# Patient Record
Sex: Male | Born: 1935
Health system: Southern US, Community
[De-identification: ages and names within clinical notes are randomized; demographics above are authoritative.]

## PROBLEM LIST (undated history)

## (undated) DIAGNOSIS — C61 Malignant neoplasm of prostate: Secondary | ICD-10-CM

## (undated) DIAGNOSIS — I10 Essential (primary) hypertension: Secondary | ICD-10-CM

## (undated) HISTORY — DX: Malignant neoplasm of prostate: C61

---

## 2007-07-15 ENCOUNTER — Other Ambulatory Visit: Payer: Self-pay

## 2007-07-15 ENCOUNTER — Emergency Department: Payer: Self-pay | Admitting: Emergency Medicine

## 2011-09-06 DIAGNOSIS — R0989 Other specified symptoms and signs involving the circulatory and respiratory systems: Secondary | ICD-10-CM | POA: Insufficient documentation

## 2011-12-24 DIAGNOSIS — L300 Nummular dermatitis: Secondary | ICD-10-CM | POA: Insufficient documentation

## 2011-12-24 DIAGNOSIS — L570 Actinic keratosis: Secondary | ICD-10-CM | POA: Insufficient documentation

## 2012-09-28 DIAGNOSIS — F17200 Nicotine dependence, unspecified, uncomplicated: Secondary | ICD-10-CM | POA: Insufficient documentation

## 2013-02-12 DIAGNOSIS — R7301 Impaired fasting glucose: Secondary | ICD-10-CM | POA: Insufficient documentation

## 2013-02-12 DIAGNOSIS — J309 Allergic rhinitis, unspecified: Secondary | ICD-10-CM | POA: Insufficient documentation

## 2013-08-21 DIAGNOSIS — I10 Essential (primary) hypertension: Secondary | ICD-10-CM | POA: Insufficient documentation

## 2014-03-27 DIAGNOSIS — Z79899 Other long term (current) drug therapy: Secondary | ICD-10-CM | POA: Insufficient documentation

## 2014-03-27 DIAGNOSIS — E785 Hyperlipidemia, unspecified: Secondary | ICD-10-CM | POA: Insufficient documentation

## 2014-04-18 DIAGNOSIS — Z79899 Other long term (current) drug therapy: Secondary | ICD-10-CM | POA: Diagnosis not present

## 2014-04-18 DIAGNOSIS — E785 Hyperlipidemia, unspecified: Secondary | ICD-10-CM | POA: Diagnosis not present

## 2014-04-18 DIAGNOSIS — I10 Essential (primary) hypertension: Secondary | ICD-10-CM | POA: Diagnosis not present

## 2014-04-18 DIAGNOSIS — R7301 Impaired fasting glucose: Secondary | ICD-10-CM | POA: Diagnosis not present

## 2014-04-23 DIAGNOSIS — R7301 Impaired fasting glucose: Secondary | ICD-10-CM | POA: Diagnosis not present

## 2014-04-23 DIAGNOSIS — F419 Anxiety disorder, unspecified: Secondary | ICD-10-CM | POA: Diagnosis not present

## 2014-04-23 DIAGNOSIS — Z23 Encounter for immunization: Secondary | ICD-10-CM | POA: Diagnosis not present

## 2014-04-23 DIAGNOSIS — Z79899 Other long term (current) drug therapy: Secondary | ICD-10-CM | POA: Diagnosis not present

## 2014-04-23 DIAGNOSIS — E785 Hyperlipidemia, unspecified: Secondary | ICD-10-CM | POA: Diagnosis not present

## 2014-04-23 DIAGNOSIS — J309 Allergic rhinitis, unspecified: Secondary | ICD-10-CM | POA: Diagnosis not present

## 2014-04-23 DIAGNOSIS — I10 Essential (primary) hypertension: Secondary | ICD-10-CM | POA: Diagnosis not present

## 2014-10-18 DIAGNOSIS — Z79899 Other long term (current) drug therapy: Secondary | ICD-10-CM | POA: Diagnosis not present

## 2014-10-18 DIAGNOSIS — E785 Hyperlipidemia, unspecified: Secondary | ICD-10-CM | POA: Diagnosis not present

## 2014-10-22 DIAGNOSIS — Z23 Encounter for immunization: Secondary | ICD-10-CM | POA: Diagnosis not present

## 2014-10-22 DIAGNOSIS — R7301 Impaired fasting glucose: Secondary | ICD-10-CM | POA: Diagnosis not present

## 2014-10-22 DIAGNOSIS — F419 Anxiety disorder, unspecified: Secondary | ICD-10-CM | POA: Diagnosis not present

## 2014-10-22 DIAGNOSIS — I10 Essential (primary) hypertension: Secondary | ICD-10-CM | POA: Diagnosis not present

## 2014-10-22 DIAGNOSIS — E785 Hyperlipidemia, unspecified: Secondary | ICD-10-CM | POA: Diagnosis not present

## 2014-10-22 DIAGNOSIS — Z79899 Other long term (current) drug therapy: Secondary | ICD-10-CM | POA: Diagnosis not present

## 2014-10-30 DIAGNOSIS — Z23 Encounter for immunization: Secondary | ICD-10-CM | POA: Diagnosis not present

## 2015-02-04 DIAGNOSIS — L570 Actinic keratosis: Secondary | ICD-10-CM | POA: Diagnosis not present

## 2015-02-04 DIAGNOSIS — L820 Inflamed seborrheic keratosis: Secondary | ICD-10-CM | POA: Diagnosis not present

## 2015-04-26 DIAGNOSIS — H6121 Impacted cerumen, right ear: Secondary | ICD-10-CM | POA: Diagnosis not present

## 2015-05-05 DIAGNOSIS — R7301 Impaired fasting glucose: Secondary | ICD-10-CM | POA: Diagnosis not present

## 2015-05-05 DIAGNOSIS — E785 Hyperlipidemia, unspecified: Secondary | ICD-10-CM | POA: Diagnosis not present

## 2015-05-05 DIAGNOSIS — I10 Essential (primary) hypertension: Secondary | ICD-10-CM | POA: Diagnosis not present

## 2015-05-05 DIAGNOSIS — Z79899 Other long term (current) drug therapy: Secondary | ICD-10-CM | POA: Diagnosis not present

## 2015-05-12 DIAGNOSIS — R7301 Impaired fasting glucose: Secondary | ICD-10-CM | POA: Diagnosis not present

## 2015-05-12 DIAGNOSIS — I10 Essential (primary) hypertension: Secondary | ICD-10-CM | POA: Diagnosis not present

## 2015-05-12 DIAGNOSIS — H9190 Unspecified hearing loss, unspecified ear: Secondary | ICD-10-CM | POA: Diagnosis not present

## 2015-05-12 DIAGNOSIS — Z79899 Other long term (current) drug therapy: Secondary | ICD-10-CM | POA: Diagnosis not present

## 2015-05-12 DIAGNOSIS — R0683 Snoring: Secondary | ICD-10-CM | POA: Diagnosis not present

## 2015-05-12 DIAGNOSIS — E785 Hyperlipidemia, unspecified: Secondary | ICD-10-CM | POA: Diagnosis not present

## 2015-05-12 DIAGNOSIS — Z23 Encounter for immunization: Secondary | ICD-10-CM | POA: Diagnosis not present

## 2015-07-28 DIAGNOSIS — Z79899 Other long term (current) drug therapy: Secondary | ICD-10-CM | POA: Diagnosis not present

## 2015-07-28 DIAGNOSIS — D649 Anemia, unspecified: Secondary | ICD-10-CM | POA: Diagnosis not present

## 2015-07-28 DIAGNOSIS — I10 Essential (primary) hypertension: Secondary | ICD-10-CM | POA: Diagnosis not present

## 2015-07-28 DIAGNOSIS — Z23 Encounter for immunization: Secondary | ICD-10-CM | POA: Diagnosis not present

## 2015-07-28 DIAGNOSIS — R7301 Impaired fasting glucose: Secondary | ICD-10-CM | POA: Diagnosis not present

## 2015-07-28 DIAGNOSIS — E785 Hyperlipidemia, unspecified: Secondary | ICD-10-CM | POA: Diagnosis not present

## 2015-10-27 DIAGNOSIS — E785 Hyperlipidemia, unspecified: Secondary | ICD-10-CM | POA: Diagnosis not present

## 2015-10-27 DIAGNOSIS — D649 Anemia, unspecified: Secondary | ICD-10-CM | POA: Diagnosis not present

## 2015-10-27 DIAGNOSIS — Z79899 Other long term (current) drug therapy: Secondary | ICD-10-CM | POA: Diagnosis not present

## 2016-03-01 DIAGNOSIS — Z79899 Other long term (current) drug therapy: Secondary | ICD-10-CM | POA: Diagnosis not present

## 2016-03-01 DIAGNOSIS — E785 Hyperlipidemia, unspecified: Secondary | ICD-10-CM | POA: Diagnosis not present

## 2016-03-08 DIAGNOSIS — R7301 Impaired fasting glucose: Secondary | ICD-10-CM | POA: Diagnosis not present

## 2016-03-08 DIAGNOSIS — G479 Sleep disorder, unspecified: Secondary | ICD-10-CM | POA: Diagnosis not present

## 2016-03-08 DIAGNOSIS — E785 Hyperlipidemia, unspecified: Secondary | ICD-10-CM | POA: Diagnosis not present

## 2016-03-08 DIAGNOSIS — Z2821 Immunization not carried out because of patient refusal: Secondary | ICD-10-CM | POA: Insufficient documentation

## 2016-03-08 DIAGNOSIS — I10 Essential (primary) hypertension: Secondary | ICD-10-CM | POA: Diagnosis not present

## 2016-03-08 DIAGNOSIS — Z23 Encounter for immunization: Secondary | ICD-10-CM | POA: Diagnosis not present

## 2016-03-08 DIAGNOSIS — Z79899 Other long term (current) drug therapy: Secondary | ICD-10-CM | POA: Diagnosis not present

## 2016-03-09 DIAGNOSIS — X32XXXA Exposure to sunlight, initial encounter: Secondary | ICD-10-CM | POA: Diagnosis not present

## 2016-03-09 DIAGNOSIS — D1801 Hemangioma of skin and subcutaneous tissue: Secondary | ICD-10-CM | POA: Diagnosis not present

## 2016-03-09 DIAGNOSIS — L570 Actinic keratosis: Secondary | ICD-10-CM | POA: Diagnosis not present

## 2016-03-09 DIAGNOSIS — L821 Other seborrheic keratosis: Secondary | ICD-10-CM | POA: Diagnosis not present

## 2016-03-09 DIAGNOSIS — D485 Neoplasm of uncertain behavior of skin: Secondary | ICD-10-CM | POA: Diagnosis not present

## 2016-03-09 DIAGNOSIS — L820 Inflamed seborrheic keratosis: Secondary | ICD-10-CM | POA: Diagnosis not present

## 2016-04-19 DIAGNOSIS — L570 Actinic keratosis: Secondary | ICD-10-CM | POA: Diagnosis not present

## 2016-04-19 DIAGNOSIS — L821 Other seborrheic keratosis: Secondary | ICD-10-CM | POA: Diagnosis not present

## 2016-04-19 DIAGNOSIS — X32XXXA Exposure to sunlight, initial encounter: Secondary | ICD-10-CM | POA: Diagnosis not present

## 2016-04-21 DIAGNOSIS — G4719 Other hypersomnia: Secondary | ICD-10-CM | POA: Diagnosis not present

## 2016-04-21 DIAGNOSIS — R0683 Snoring: Secondary | ICD-10-CM | POA: Diagnosis not present

## 2016-04-21 DIAGNOSIS — I10 Essential (primary) hypertension: Secondary | ICD-10-CM | POA: Diagnosis not present

## 2016-05-14 DIAGNOSIS — G4710 Hypersomnia, unspecified: Secondary | ICD-10-CM | POA: Diagnosis not present

## 2016-05-21 DIAGNOSIS — G4733 Obstructive sleep apnea (adult) (pediatric): Secondary | ICD-10-CM | POA: Diagnosis not present

## 2016-05-24 DIAGNOSIS — I10 Essential (primary) hypertension: Secondary | ICD-10-CM | POA: Diagnosis not present

## 2016-05-24 DIAGNOSIS — R0683 Snoring: Secondary | ICD-10-CM | POA: Diagnosis not present

## 2016-05-24 DIAGNOSIS — G4733 Obstructive sleep apnea (adult) (pediatric): Secondary | ICD-10-CM | POA: Diagnosis not present

## 2016-05-24 DIAGNOSIS — F17200 Nicotine dependence, unspecified, uncomplicated: Secondary | ICD-10-CM | POA: Diagnosis not present

## 2016-07-22 DIAGNOSIS — G4733 Obstructive sleep apnea (adult) (pediatric): Secondary | ICD-10-CM | POA: Diagnosis not present

## 2016-07-23 DIAGNOSIS — G4719 Other hypersomnia: Secondary | ICD-10-CM | POA: Diagnosis not present

## 2016-07-23 DIAGNOSIS — Z9989 Dependence on other enabling machines and devices: Secondary | ICD-10-CM | POA: Diagnosis not present

## 2016-07-23 DIAGNOSIS — G4733 Obstructive sleep apnea (adult) (pediatric): Secondary | ICD-10-CM | POA: Insufficient documentation

## 2016-07-23 DIAGNOSIS — G4769 Other sleep related movement disorders: Secondary | ICD-10-CM | POA: Diagnosis not present

## 2016-07-23 DIAGNOSIS — I10 Essential (primary) hypertension: Secondary | ICD-10-CM | POA: Diagnosis not present

## 2016-08-30 DIAGNOSIS — E785 Hyperlipidemia, unspecified: Secondary | ICD-10-CM | POA: Diagnosis not present

## 2016-08-30 DIAGNOSIS — R7301 Impaired fasting glucose: Secondary | ICD-10-CM | POA: Diagnosis not present

## 2016-08-30 DIAGNOSIS — I10 Essential (primary) hypertension: Secondary | ICD-10-CM | POA: Diagnosis not present

## 2016-08-30 DIAGNOSIS — Z79899 Other long term (current) drug therapy: Secondary | ICD-10-CM | POA: Diagnosis not present

## 2016-09-06 DIAGNOSIS — L570 Actinic keratosis: Secondary | ICD-10-CM | POA: Diagnosis not present

## 2016-09-06 DIAGNOSIS — D1801 Hemangioma of skin and subcutaneous tissue: Secondary | ICD-10-CM | POA: Diagnosis not present

## 2016-09-06 DIAGNOSIS — L821 Other seborrheic keratosis: Secondary | ICD-10-CM | POA: Diagnosis not present

## 2016-09-06 DIAGNOSIS — X32XXXA Exposure to sunlight, initial encounter: Secondary | ICD-10-CM | POA: Diagnosis not present

## 2016-09-06 DIAGNOSIS — I781 Nevus, non-neoplastic: Secondary | ICD-10-CM | POA: Diagnosis not present

## 2016-10-04 DIAGNOSIS — I10 Essential (primary) hypertension: Secondary | ICD-10-CM | POA: Diagnosis not present

## 2016-10-04 DIAGNOSIS — Z79899 Other long term (current) drug therapy: Secondary | ICD-10-CM | POA: Diagnosis not present

## 2016-10-04 DIAGNOSIS — R7301 Impaired fasting glucose: Secondary | ICD-10-CM | POA: Diagnosis not present

## 2016-10-04 DIAGNOSIS — E785 Hyperlipidemia, unspecified: Secondary | ICD-10-CM | POA: Diagnosis not present

## 2016-10-18 DIAGNOSIS — D2262 Melanocytic nevi of left upper limb, including shoulder: Secondary | ICD-10-CM | POA: Diagnosis not present

## 2016-10-18 DIAGNOSIS — D2261 Melanocytic nevi of right upper limb, including shoulder: Secondary | ICD-10-CM | POA: Diagnosis not present

## 2016-10-18 DIAGNOSIS — D225 Melanocytic nevi of trunk: Secondary | ICD-10-CM | POA: Diagnosis not present

## 2016-10-18 DIAGNOSIS — L570 Actinic keratosis: Secondary | ICD-10-CM | POA: Diagnosis not present

## 2016-11-16 DIAGNOSIS — I10 Essential (primary) hypertension: Secondary | ICD-10-CM | POA: Diagnosis not present

## 2016-11-16 DIAGNOSIS — R05 Cough: Secondary | ICD-10-CM | POA: Diagnosis not present

## 2016-11-16 DIAGNOSIS — J019 Acute sinusitis, unspecified: Secondary | ICD-10-CM | POA: Diagnosis not present

## 2017-02-07 DIAGNOSIS — I10 Essential (primary) hypertension: Secondary | ICD-10-CM | POA: Diagnosis not present

## 2017-02-07 DIAGNOSIS — E785 Hyperlipidemia, unspecified: Secondary | ICD-10-CM | POA: Diagnosis not present

## 2017-02-07 DIAGNOSIS — Z79899 Other long term (current) drug therapy: Secondary | ICD-10-CM | POA: Diagnosis not present

## 2017-02-14 DIAGNOSIS — E785 Hyperlipidemia, unspecified: Secondary | ICD-10-CM | POA: Diagnosis not present

## 2017-02-14 DIAGNOSIS — Z23 Encounter for immunization: Secondary | ICD-10-CM | POA: Diagnosis not present

## 2017-02-14 DIAGNOSIS — R7301 Impaired fasting glucose: Secondary | ICD-10-CM | POA: Diagnosis not present

## 2017-02-14 DIAGNOSIS — Z79899 Other long term (current) drug therapy: Secondary | ICD-10-CM | POA: Diagnosis not present

## 2017-02-14 DIAGNOSIS — I10 Essential (primary) hypertension: Secondary | ICD-10-CM | POA: Diagnosis not present

## 2017-03-07 DIAGNOSIS — L821 Other seborrheic keratosis: Secondary | ICD-10-CM | POA: Diagnosis not present

## 2017-03-07 DIAGNOSIS — X32XXXA Exposure to sunlight, initial encounter: Secondary | ICD-10-CM | POA: Diagnosis not present

## 2017-03-07 DIAGNOSIS — L570 Actinic keratosis: Secondary | ICD-10-CM | POA: Diagnosis not present

## 2017-08-08 DIAGNOSIS — I10 Essential (primary) hypertension: Secondary | ICD-10-CM | POA: Diagnosis not present

## 2017-08-08 DIAGNOSIS — Z79899 Other long term (current) drug therapy: Secondary | ICD-10-CM | POA: Diagnosis not present

## 2017-08-08 DIAGNOSIS — R7301 Impaired fasting glucose: Secondary | ICD-10-CM | POA: Diagnosis not present

## 2017-08-08 DIAGNOSIS — E785 Hyperlipidemia, unspecified: Secondary | ICD-10-CM | POA: Diagnosis not present

## 2017-08-16 DIAGNOSIS — Z79899 Other long term (current) drug therapy: Secondary | ICD-10-CM | POA: Diagnosis not present

## 2017-08-16 DIAGNOSIS — R7301 Impaired fasting glucose: Secondary | ICD-10-CM | POA: Diagnosis not present

## 2017-08-16 DIAGNOSIS — M720 Palmar fascial fibromatosis [Dupuytren]: Secondary | ICD-10-CM | POA: Insufficient documentation

## 2017-08-16 DIAGNOSIS — R809 Proteinuria, unspecified: Secondary | ICD-10-CM | POA: Diagnosis not present

## 2017-08-16 DIAGNOSIS — I10 Essential (primary) hypertension: Secondary | ICD-10-CM | POA: Diagnosis not present

## 2017-08-16 DIAGNOSIS — E785 Hyperlipidemia, unspecified: Secondary | ICD-10-CM | POA: Diagnosis not present

## 2017-08-29 DIAGNOSIS — I10 Essential (primary) hypertension: Secondary | ICD-10-CM | POA: Diagnosis not present

## 2017-08-29 DIAGNOSIS — M720 Palmar fascial fibromatosis [Dupuytren]: Secondary | ICD-10-CM | POA: Diagnosis not present

## 2017-09-12 DIAGNOSIS — L570 Actinic keratosis: Secondary | ICD-10-CM | POA: Diagnosis not present

## 2017-09-12 DIAGNOSIS — L821 Other seborrheic keratosis: Secondary | ICD-10-CM | POA: Diagnosis not present

## 2017-09-12 DIAGNOSIS — X32XXXA Exposure to sunlight, initial encounter: Secondary | ICD-10-CM | POA: Diagnosis not present

## 2017-09-12 DIAGNOSIS — S80862A Insect bite (nonvenomous), left lower leg, initial encounter: Secondary | ICD-10-CM | POA: Diagnosis not present

## 2017-09-26 DIAGNOSIS — R94128 Abnormal results of other function studies of ear and other special senses: Secondary | ICD-10-CM | POA: Diagnosis not present

## 2017-09-26 DIAGNOSIS — Z01118 Encounter for examination of ears and hearing with other abnormal findings: Secondary | ICD-10-CM | POA: Diagnosis not present

## 2017-09-26 DIAGNOSIS — Z23 Encounter for immunization: Secondary | ICD-10-CM | POA: Diagnosis not present

## 2017-09-26 DIAGNOSIS — Z0000 Encounter for general adult medical examination without abnormal findings: Secondary | ICD-10-CM | POA: Diagnosis not present

## 2017-09-26 DIAGNOSIS — I10 Essential (primary) hypertension: Secondary | ICD-10-CM | POA: Diagnosis not present

## 2017-12-26 DIAGNOSIS — S39013A Strain of muscle, fascia and tendon of pelvis, initial encounter: Secondary | ICD-10-CM | POA: Diagnosis not present

## 2017-12-26 DIAGNOSIS — M79651 Pain in right thigh: Secondary | ICD-10-CM | POA: Diagnosis not present

## 2017-12-26 DIAGNOSIS — M25551 Pain in right hip: Secondary | ICD-10-CM | POA: Diagnosis not present

## 2017-12-26 DIAGNOSIS — M1611 Unilateral primary osteoarthritis, right hip: Secondary | ICD-10-CM | POA: Diagnosis not present

## 2017-12-26 DIAGNOSIS — M47816 Spondylosis without myelopathy or radiculopathy, lumbar region: Secondary | ICD-10-CM | POA: Diagnosis not present

## 2018-01-04 DIAGNOSIS — S76011A Strain of muscle, fascia and tendon of right hip, initial encounter: Secondary | ICD-10-CM | POA: Diagnosis not present

## 2018-01-10 DIAGNOSIS — M25551 Pain in right hip: Secondary | ICD-10-CM | POA: Diagnosis not present

## 2018-01-10 DIAGNOSIS — M1611 Unilateral primary osteoarthritis, right hip: Secondary | ICD-10-CM | POA: Diagnosis not present

## 2018-01-13 DIAGNOSIS — M5136 Other intervertebral disc degeneration, lumbar region: Secondary | ICD-10-CM | POA: Diagnosis not present

## 2018-01-13 DIAGNOSIS — I878 Other specified disorders of veins: Secondary | ICD-10-CM | POA: Diagnosis not present

## 2018-01-13 DIAGNOSIS — Z87891 Personal history of nicotine dependence: Secondary | ICD-10-CM | POA: Diagnosis not present

## 2018-01-13 DIAGNOSIS — M47816 Spondylosis without myelopathy or radiculopathy, lumbar region: Secondary | ICD-10-CM | POA: Diagnosis not present

## 2018-01-13 DIAGNOSIS — M25551 Pain in right hip: Secondary | ICD-10-CM | POA: Diagnosis not present

## 2018-01-13 DIAGNOSIS — M791 Myalgia, unspecified site: Secondary | ICD-10-CM | POA: Diagnosis not present

## 2018-01-13 DIAGNOSIS — R102 Pelvic and perineal pain: Secondary | ICD-10-CM | POA: Diagnosis not present

## 2018-01-13 DIAGNOSIS — R1031 Right lower quadrant pain: Secondary | ICD-10-CM | POA: Diagnosis not present

## 2018-01-13 DIAGNOSIS — I10 Essential (primary) hypertension: Secondary | ICD-10-CM | POA: Diagnosis not present

## 2018-01-13 DIAGNOSIS — M1990 Unspecified osteoarthritis, unspecified site: Secondary | ICD-10-CM | POA: Diagnosis not present

## 2018-01-13 DIAGNOSIS — M19011 Primary osteoarthritis, right shoulder: Secondary | ICD-10-CM | POA: Diagnosis not present

## 2018-01-13 DIAGNOSIS — M19012 Primary osteoarthritis, left shoulder: Secondary | ICD-10-CM | POA: Diagnosis not present

## 2018-01-13 DIAGNOSIS — M353 Polymyalgia rheumatica: Secondary | ICD-10-CM | POA: Diagnosis not present

## 2018-01-13 DIAGNOSIS — R6883 Chills (without fever): Secondary | ICD-10-CM | POA: Diagnosis not present

## 2018-01-13 DIAGNOSIS — M25552 Pain in left hip: Secondary | ICD-10-CM | POA: Diagnosis not present

## 2018-01-13 DIAGNOSIS — M1611 Unilateral primary osteoarthritis, right hip: Secondary | ICD-10-CM | POA: Diagnosis not present

## 2018-01-19 DIAGNOSIS — M791 Myalgia, unspecified site: Secondary | ICD-10-CM | POA: Diagnosis not present

## 2018-01-19 DIAGNOSIS — I10 Essential (primary) hypertension: Secondary | ICD-10-CM | POA: Diagnosis not present

## 2018-01-19 DIAGNOSIS — M6281 Muscle weakness (generalized): Secondary | ICD-10-CM | POA: Diagnosis not present

## 2018-01-23 DIAGNOSIS — E785 Hyperlipidemia, unspecified: Secondary | ICD-10-CM | POA: Diagnosis not present

## 2018-01-23 DIAGNOSIS — M791 Myalgia, unspecified site: Secondary | ICD-10-CM | POA: Diagnosis not present

## 2018-01-23 DIAGNOSIS — R6883 Chills (without fever): Secondary | ICD-10-CM | POA: Diagnosis not present

## 2018-01-23 DIAGNOSIS — Z79899 Other long term (current) drug therapy: Secondary | ICD-10-CM | POA: Diagnosis not present

## 2018-01-23 DIAGNOSIS — R809 Proteinuria, unspecified: Secondary | ICD-10-CM | POA: Diagnosis not present

## 2018-01-24 DIAGNOSIS — Z7952 Long term (current) use of systemic steroids: Secondary | ICD-10-CM | POA: Diagnosis not present

## 2018-01-24 DIAGNOSIS — M353 Polymyalgia rheumatica: Secondary | ICD-10-CM | POA: Insufficient documentation

## 2018-01-24 DIAGNOSIS — I10 Essential (primary) hypertension: Secondary | ICD-10-CM | POA: Diagnosis not present

## 2018-01-30 DIAGNOSIS — I10 Essential (primary) hypertension: Secondary | ICD-10-CM | POA: Diagnosis not present

## 2018-01-30 DIAGNOSIS — E785 Hyperlipidemia, unspecified: Secondary | ICD-10-CM | POA: Diagnosis not present

## 2018-01-30 DIAGNOSIS — Z79899 Other long term (current) drug therapy: Secondary | ICD-10-CM | POA: Diagnosis not present

## 2018-02-21 DIAGNOSIS — I10 Essential (primary) hypertension: Secondary | ICD-10-CM | POA: Diagnosis not present

## 2018-02-21 DIAGNOSIS — Z79899 Other long term (current) drug therapy: Secondary | ICD-10-CM | POA: Diagnosis not present

## 2018-02-21 DIAGNOSIS — M353 Polymyalgia rheumatica: Secondary | ICD-10-CM | POA: Diagnosis not present

## 2018-03-20 DIAGNOSIS — D2262 Melanocytic nevi of left upper limb, including shoulder: Secondary | ICD-10-CM | POA: Diagnosis not present

## 2018-03-20 DIAGNOSIS — D2272 Melanocytic nevi of left lower limb, including hip: Secondary | ICD-10-CM | POA: Diagnosis not present

## 2018-03-20 DIAGNOSIS — L570 Actinic keratosis: Secondary | ICD-10-CM | POA: Diagnosis not present

## 2018-03-20 DIAGNOSIS — D2271 Melanocytic nevi of right lower limb, including hip: Secondary | ICD-10-CM | POA: Diagnosis not present

## 2018-03-20 DIAGNOSIS — X32XXXA Exposure to sunlight, initial encounter: Secondary | ICD-10-CM | POA: Diagnosis not present

## 2018-03-20 DIAGNOSIS — D2261 Melanocytic nevi of right upper limb, including shoulder: Secondary | ICD-10-CM | POA: Diagnosis not present

## 2018-03-20 DIAGNOSIS — L608 Other nail disorders: Secondary | ICD-10-CM | POA: Diagnosis not present

## 2018-03-20 DIAGNOSIS — L821 Other seborrheic keratosis: Secondary | ICD-10-CM | POA: Diagnosis not present

## 2018-03-20 DIAGNOSIS — D225 Melanocytic nevi of trunk: Secondary | ICD-10-CM | POA: Diagnosis not present

## 2018-11-15 ENCOUNTER — Emergency Department
Admission: EM | Admit: 2018-11-15 | Discharge: 2018-11-15 | Disposition: A | Discharge status: Home / Self Care | Payer: Medicare Other | Attending: Emergency Medicine | Admitting: Emergency Medicine

## 2018-11-15 ENCOUNTER — Other Ambulatory Visit: Payer: Self-pay

## 2018-11-15 ENCOUNTER — Encounter: Payer: Self-pay | Admitting: Emergency Medicine

## 2018-11-15 DIAGNOSIS — L03115 Cellulitis of right lower limb: Secondary | ICD-10-CM | POA: Insufficient documentation

## 2018-11-15 DIAGNOSIS — L539 Erythematous condition, unspecified: Secondary | ICD-10-CM | POA: Diagnosis present

## 2018-11-15 MED ORDER — SULFAMETHOXAZOLE-TRIMETHOPRIM 800-160 MG PO TABS: 1.0000 | ORAL_TABLET | Freq: Two times a day (BID) | ORAL | 0 refills | Status: DC

## 2018-11-15 MED ORDER — SULFAMETHOXAZOLE-TRIMETHOPRIM 800-160 MG PO TABS
1.0000 | ORAL_TABLET | Freq: Once | ORAL | Status: AC
  Administered 2018-11-15: 1 via ORAL
  Filled 2018-11-15: qty 1

## 2018-11-15 NOTE — ED Provider Notes (Signed)
[facility] EMERGENCY DEPARTMENT Provider Note   CSN: [id number] Arrival date & time: 11/15/18  1704     History   Chief Complaint Chief Complaint  Patient presents with  . Wound Check    HPI Alexis Cruz is a 83 y.o. male.  Presents to the emergency department for evaluation of right ankle wound with redness.  Patient states Saturday, 3 days ago he developed a small bump to the anterior aspect of the right ankle along the joint line.  No trauma, injury.  Denies ever seeing any broken skin.  His son is concerned about a possible spider bite.  Patient states over last 3 days has had some slight swelling in the foot and ankle as well as increasing redness along the wound.  No drainage, fevers, calf pain.  No numbness in the foot or ankle.  His pain is 3 out of 10.    HPI  History reviewed. No pertinent past medical history.  There are no active problems to display for this patient.   History reviewed. No pertinent surgical history.      Home Medications    Prior to Admission medications   Medication Sig Start Date End Date Taking? Authorizing Provider  sulfamethoxazole-trimethoprim (BACTRIM DS) 800-160 MG tablet Take 1 tablet by mouth 2 (two) times daily. 11/15/18   Duanne Guess, PA-C    Family History No family history on file.  Social History Social History   Tobacco Use  . Smoking status: Never Smoker  . Smokeless tobacco: Never Used  Substance Use Topics  . Alcohol use: Yes    Comment: rarely  . Drug use: Not on file     Allergies   Patient has no known allergies.   Review of Systems Review of Systems  Constitutional: Negative for fever.  Musculoskeletal: Negative for joint swelling and myalgias.  Skin: Positive for rash and wound.  Neurological: Negative for numbness and headaches.     Physical Exam Updated Vital Signs BP (!) 111/56 (BP Location: Left Arm)   Pulse 85   Temp 98.3 F (36.8 C) (Oral)   Resp 20   Ht  5\' 5"  (1.651 m)   Wt 72.6 kg   SpO2 98%   BMI 26.63 kg/m   Physical Exam Constitutional:      Appearance: He is well-developed.  HENT:     Head: Normocephalic and atraumatic.  Eyes:     Conjunctiva/sclera: Conjunctivae normal.  Neck:     Musculoskeletal: Normal range of motion.  Cardiovascular:     Rate and Rhythm: Normal rate.  Pulmonary:     Effort: Pulmonary effort is normal. No respiratory distress.  Musculoskeletal: Normal range of motion.     Comments: Right ankle with 5 x 5 cm area of erythema with mild ecchymosis along the central portion of the area of erythema with no fluctuance.  No skin breakdown noted.  Minimal warmth.  No edema or induration.  There is a smaller surrounding area of erythema approximately 3 to 4 cm in length and width along the lateral aspect of the wound with a very pale area of erythema.  2+ dorsalis pedis pulses.  Main area of wound was deep erythema and ecchymosis is marked with a skin pen with a solid line while the lighter area of erythema is marked with a dotted line  Skin:    General: Skin is warm.     Findings: No rash.  Neurological:     Mental Status: He is  alert and oriented to person, place, and time.  Psychiatric:        Behavior: Behavior normal.        Thought Content: Thought content normal.      ED Treatments / Results  Labs (all labs ordered are listed, but only abnormal results are displayed) Labs Reviewed - No data to display  EKG None  Radiology No results found.  Procedures Procedures (including critical care time)  Medications Ordered in ED Medications  sulfamethoxazole-trimethoprim (BACTRIM DS) 800-160 MG per tablet 1 tablet (has no administration in time range)     Initial Impression / Assessment and Plan / ED Course  I have reviewed the triage vital signs and the nursing notes.  Pertinent labs & imaging results that were available during my care of the patient were reviewed by me and considered in my  medical decision making (see chart for details).        83 year old male with cellulitis of the right ankle.  Possible insect or spider bite.  No skin breakdown noted.  No fluctuance or induration.  No sign of an abscess.  Vital signs are stable.  Afebrile.  No sign of blood clot.  Patient is started on Bactrim.  Skin is marked with a pen.  He understands signs and symptoms return to the ED for.  Final Clinical Impressions(s) / ED Diagnoses   Final diagnoses:  Cellulitis of right ankle    ED Discharge Orders         Ordered    sulfamethoxazole-trimethoprim (BACTRIM DS) 800-160 MG tablet  2 times daily     11/15/18 1816           Renata Caprice 11/15/18 1820    Lavonia Drafts, MD 11/15/18 [phone]

## 2018-11-15 NOTE — ED Triage Notes (Signed)
Patient presents to the ED with a purplish/reddish area to his right ankle approx. The size of a golf-ball.  Patient noticed a small bump to area on Saturday.  Patient went to the dermatologist Monday and was prescribed cream which is not working to improve area.  Area has grown larger in size.  No obvious streaking from area.

## 2018-11-15 NOTE — ED Notes (Signed)
Pt has possible insect bite to left ankle area.  Sx for 5-6 days.  Pt states saw dermatologist and was given a cream.  Area no better.  No drainage. No itching.  Area red .

## 2018-11-15 NOTE — Discharge Instructions (Signed)
Please take antibiotics as prescribed.  Continue to rest ice and elevate the right ankle.  If redness is exceeding outside skin pen line by 1 cm return to the ER.  Also return to the ER for any fevers increasing leg pain/swelling or for any urgent changes in her health.

## 2019-10-25 ENCOUNTER — Other Ambulatory Visit: Payer: Self-pay | Admitting: Rheumatology

## 2019-10-25 DIAGNOSIS — G8929 Other chronic pain: Secondary | ICD-10-CM

## 2019-10-25 DIAGNOSIS — M353 Polymyalgia rheumatica: Secondary | ICD-10-CM

## 2019-10-29 ENCOUNTER — Other Ambulatory Visit: Payer: Self-pay

## 2019-10-29 ENCOUNTER — Ambulatory Visit
Admission: RE | Admit: 2019-10-29 | Discharge: 2019-10-29 | Disposition: A | Discharge status: Home / Self Care | Payer: Medicare Other | Source: Ambulatory Visit | Attending: Rheumatology | Admitting: Rheumatology

## 2019-10-29 DIAGNOSIS — M25552 Pain in left hip: Secondary | ICD-10-CM | POA: Insufficient documentation

## 2019-10-29 DIAGNOSIS — M353 Polymyalgia rheumatica: Secondary | ICD-10-CM | POA: Insufficient documentation

## 2019-10-29 DIAGNOSIS — G8929 Other chronic pain: Secondary | ICD-10-CM | POA: Diagnosis present

## 2019-11-08 ENCOUNTER — Encounter: Payer: Self-pay | Admitting: Oncology

## 2019-11-08 ENCOUNTER — Inpatient Hospital Stay: Payer: Medicare Other | Attending: Oncology | Admitting: Oncology

## 2019-11-08 ENCOUNTER — Encounter (INDEPENDENT_AMBULATORY_CARE_PROVIDER_SITE_OTHER): Payer: Self-pay

## 2019-11-08 ENCOUNTER — Other Ambulatory Visit: Payer: Self-pay

## 2019-11-08 ENCOUNTER — Inpatient Hospital Stay: Payer: Medicare Other

## 2019-11-08 VITALS — BP 143/72 | HR 72 | Temp 98.1°F | Wt 166.9 lb

## 2019-11-08 DIAGNOSIS — C801 Malignant (primary) neoplasm, unspecified: Secondary | ICD-10-CM | POA: Insufficient documentation

## 2019-11-08 DIAGNOSIS — C7951 Secondary malignant neoplasm of bone: Secondary | ICD-10-CM

## 2019-11-08 LAB — CBC WITH DIFFERENTIAL/PLATELET
Abs Immature Granulocytes: 0.12 10*3/uL — ABNORMAL HIGH (ref 0.00–0.07)
Basophils Absolute: 0 10*3/uL (ref 0.0–0.1)
Basophils Relative: 0 %
Eosinophils Absolute: 0 10*3/uL (ref 0.0–0.5)
Eosinophils Relative: 0 %
HCT: 35.1 % — ABNORMAL LOW (ref 39.0–52.0)
Hemoglobin: 12.1 g/dL — ABNORMAL LOW (ref 13.0–17.0)
Immature Granulocytes: 1 %
Lymphocytes Relative: 14 %
Lymphs Abs: 1.3 10*3/uL (ref 0.7–4.0)
MCH: 30.7 pg (ref 26.0–34.0)
MCHC: 34.5 g/dL (ref 30.0–36.0)
MCV: 89.1 fL (ref 80.0–100.0)
Monocytes Absolute: 1 10*3/uL (ref 0.1–1.0)
Monocytes Relative: 11 %
Neutro Abs: 7 10*3/uL (ref 1.7–7.7)
Neutrophils Relative %: 74 %
Platelets: 175 10*3/uL (ref 150–400)
RBC: 3.94 MIL/uL — ABNORMAL LOW (ref 4.22–5.81)
RDW: 14.6 % (ref 11.5–15.5)
WBC: 9.5 10*3/uL (ref 4.0–10.5)
nRBC: 0 % (ref 0.0–0.2)

## 2019-11-08 LAB — COMPREHENSIVE METABOLIC PANEL
ALT: 17 U/L (ref 0–44)
AST: 20 U/L (ref 15–41)
Albumin: 4.5 g/dL (ref 3.5–5.0)
Alkaline Phosphatase: 105 U/L (ref 38–126)
Anion gap: 8 (ref 5–15)
BUN: 38 mg/dL — ABNORMAL HIGH (ref 8–23)
CO2: 26 mmol/L (ref 22–32)
Calcium: 9.3 mg/dL (ref 8.9–10.3)
Chloride: 101 mmol/L (ref 98–111)
Creatinine, Ser: 1.41 mg/dL — ABNORMAL HIGH (ref 0.61–1.24)
GFR calc Af Amer: 54 mL/min — ABNORMAL LOW (ref >60)
GFR calc non Af Amer: 46 mL/min — ABNORMAL LOW (ref >60)
Glucose, Bld: 111 mg/dL — ABNORMAL HIGH (ref 70–99)
Potassium: 5.1 mmol/L (ref 3.5–5.1)
Sodium: 135 mmol/L (ref 135–145)
Total Bilirubin: 0.7 mg/dL (ref 0.3–1.2)
Total Protein: 8.2 g/dL — ABNORMAL HIGH (ref 6.5–8.1)

## 2019-11-08 LAB — PSA: Prostatic Specific Antigen: 35.02 ng/mL — ABNORMAL HIGH (ref 0.00–4.00)

## 2019-11-08 NOTE — Progress Notes (Signed)
Hematology/Oncology Consult note [facility] [phone] [phone]  Patient Care Team: System, Pcp Not In as PCP - General   Name of the patient: Alexis Cruz  [id number]  [birth year]    Reason for referral-bone metastases   Referring physician-Dr. Ephriam Jenkins  Date of visit: 11/08/19   History of presenting illness- Patient is a 84 year old Caucasian gentleman who was initially seen by rheumatology Dr. Posey Pronto for symptoms of left hip pain which prompted x-rays followed by MRI of the left hip without contrast.  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  Patient was subsequently seen by orthopedics Dr. Pathology who did not recommend any orthopedic intervention for the left hip fracture he has been referred to oncology for further management patient's last PSA was checked in 2014 which was elevated at 7.4.  Patient is also a chronic smoker and smokes about 1 pack of cigarettes per day since 1968.  Patient lives with his wife and is independent of his ADLs.  Reports that his appetite is good and denies any unintentional weight loss.  He does report some pain on ambulation and has been taking occasional ibuprofen for the same.  ECOG PS- 1  Pain scale- 5   Review of systems- Review of Systems  Constitutional: Positive for malaise/fatigue. Negative for chills, fever and weight loss.  HENT: Negative for congestion, ear discharge and nosebleeds.   Eyes: Negative for blurred vision.  Respiratory: Negative for cough, hemoptysis, sputum production, shortness of breath and wheezing.   Cardiovascular: Negative for chest pain, palpitations, orthopnea and claudication.  Gastrointestinal: Negative for abdominal pain, blood in stool, constipation, diarrhea, heartburn, melena, nausea and vomiting.  Genitourinary:  Negative for dysuria, flank pain, frequency, hematuria and urgency.  Musculoskeletal: Negative for back pain, joint pain and myalgias.       Left hip pain  Skin: Negative for rash.  Neurological: Negative for dizziness, tingling, focal weakness, seizures, weakness and headaches.  Endo/Heme/Allergies: Does not bruise/bleed easily.  Psychiatric/Behavioral: Negative for depression and suicidal ideas. The patient does not have insomnia.     Allergies  Allergen Reactions  . Citalopram Other (See Comments)    Unsure but family remembers that he could not take the dru-not sure what happened    There are no problems to display for this patient.    No past medical history on file.   No past surgical history on file.  Social History   Socioeconomic History  . Marital status: Married    Spouse name: Not on file  . Number of children: Not on file  . Years of education: Not on file  . Highest education level: Not on file  Occupational History  . Not on file  Tobacco Use  . Smoking status: Never Smoker  . Smokeless tobacco: Never Used  Substance and Sexual Activity  . Alcohol use: Yes    Comment: rarely  . Drug use: Not on file  . Sexual activity: Not on file  Other Topics Concern  . Not on file  Social History Narrative  . Not on file   Social Determinants of Health   Financial Resource Strain:   . Difficulty of Paying Living Expenses: Not on file  Food Insecurity:   . Worried About Charity fundraiser in the Last Year: Not on file  . Ran Out of Food in the Last Year: Not  on file  Transportation Needs:   . Lack of Transportation (Medical): Not on file  . Lack of Transportation (Non-Medical): Not on file  Physical Activity:   . Days of Exercise per Week: Not on file  . Minutes of Exercise per Session: Not on file  Stress:   . Feeling of Stress : Not on file  Social Connections:   . Frequency of Communication with Friends and Family: Not on file  . Frequency of Social  Gatherings with Friends and Family: Not on file  . Attends Religious Services: Not on file  . Active Member of Clubs or Organizations: Not on file  . Attends Archivist Meetings: Not on file  . Marital Status: Not on file  Intimate Partner Violence:   . Fear of Current or Ex-Partner: Not on file  . Emotionally Abused: Not on file  . Physically Abused: Not on file  . Sexually Abused: Not on file     No family history on file.   Current Outpatient Medications:  .  Cholecalciferol 125 MCG (5000 UT) capsule, Take 5,000 Units by mouth once a week., Disp: , Rfl:  .  clonazePAM (KLONOPIN) 0.5 MG tablet, Take 1 tablet by mouth at bedtime as needed., Disp: , Rfl:  .  ibuprofen (ADVIL) 200 MG tablet, Take 200 mg by mouth 2 (two) times daily as needed., Disp: , Rfl:  .  lisinopril-hydrochlorothiazide (ZESTORETIC) 20-12.5 MG tablet, Take 1 tablet by mouth daily., Disp: , Rfl:  .  predniSONE (DELTASONE) 10 MG tablet, Take 5 mg by mouth daily., Disp: , Rfl:  .  Zinc 50 MG CAPS, Take 1 capsule by mouth daily., Disp: , Rfl:    Physical exam:  Vitals:   11/08/19 1101  BP: (!) 143/72  Pulse: 72  Temp: 98.1 F (36.7 C)  TempSrc: Tympanic  SpO2: 98%  Weight: 166 lb 14.4 oz (75.7 kg)   Physical Exam HENT:     Head: Normocephalic and atraumatic.  Eyes:     Pupils: Pupils are equal, round, and reactive to light.  Cardiovascular:     Rate and Rhythm: Normal rate and regular rhythm.     Heart sounds: Normal heart sounds.  Pulmonary:     Effort: Pulmonary effort is normal.     Breath sounds: Normal breath sounds.  Abdominal:     General: Bowel sounds are normal. There is no distension.     Palpations: Abdomen is soft.     Tenderness: There is no abdominal tenderness.  Musculoskeletal:     Cervical back: Normal range of motion.     Comments: Patient walks with a slight limp.  No overt swelling or deformity noted over the left lower extremity  Lymphadenopathy:     Comments: No  palpable cervical, supraclavicular, axillary or inguinal adenopathy   Skin:    General: Skin is warm and dry.  Neurological:     Mental Status: He is alert and oriented to person, place, and time.        CMP Latest Ref Rng & Units 11/08/2019  Glucose 70 - 99 mg/dL 111(H)  BUN 8 - 23 mg/dL 38(H)  Creatinine 0.61 - 1.24 mg/dL 1.41(H)  Sodium 135 - 145 mmol/L 135  Potassium 3.5 - 5.1 mmol/L 5.1  Chloride 98 - 111 mmol/L 101  CO2 22 - 32 mmol/L 26  Calcium 8.9 - 10.3 mg/dL 9.3  Total Protein 6.5 - 8.1 g/dL 8.2(H)  Total Bilirubin 0.3 - 1.2 mg/dL 0.7  Alkaline Phos  38 - 126 U/L 105  AST 15 - 41 U/L 20  ALT 0 - 44 U/L 17   CBC Latest Ref Rng & Units 11/08/2019  WBC 4.0 - 10.5 K/uL 9.5  Hemoglobin 13.0 - 17.0 g/dL 12.1(L)  Hematocrit 39 - 52 % 35.1(L)  Platelets 150 - 400 K/uL 175    No images are attached to the encounter.  MR HIP LEFT WO CONTRAST  Result Date: 10/30/2019 CLINICAL DATA:  Low back and left thigh pain for 3 weeks. EXAM: MR OF THE LEFT HIP WITHOUT CONTRAST TECHNIQUE: Multiplanar, multisequence MR imaging was performed. No intravenous contrast was administered. COMPARISON:  Radiographs 10/22/2019 are not available for comparison. FINDINGS: Diffuse metastatic bone disease is demonstrated with numerous low T1 and high T2 signal intensity lesions involving the lower lumbar spine, pelvis and both hips. There is a infiltrating lesion involving the left acetabulum with a pathologic stress or insufficiency fracture likely causing the patient's left hip pain. No hip fracture. No significant intrapelvic abnormalities are identified. The prostate gland is grossly normal. No obvious mass but prostate cancer would certainly be a consideration. No pelvic adenopathy is identified. Hardware noted on the left femur with associated artifact. IMPRESSION: 1. Diffuse metastatic bone disease involving the lower lumbar spine, pelvis and both hips. Recommend oncology referral for metastatic  workup. 2. Pathologic stress or insufficiency fracture involving the left acetabulum likely causing the patient's left hip pain. 3. No significant intrapelvic abnormalities. Electronically Signed   By: Marijo Sanes M.D.   On: 10/30/2019 06:49    Assessment and plan- Patient is a 84 y.o. male with newly diagnosed diffuse bony metastatic disease and possible stress fracture of the left acetabulum.  Work-up pending  1.  I discussed the results of MRI pelvis with the patient and his wife in detail.  He has evidence of diffuse bony metastatic disease but the primary source of malignancy is presently unclear.  Prostate cancer versus lung cancer remain among the top differentials.  Today I will check a CBC with differential, CMP and PSA.  I will obtain a PET CT scan to a certain the primary source.  Based on PET CT scan findings we will decide what we can biopsy.  I would hold off on getting a bone biopsy at this time as sometimes it may give nondiagnostic results.  2.  With regards to the left hip pain and possible fracture he was evaluated by orthopedics who did not recommend any surgical intervention.  I will be referring patient to Dr. Donella Stade from radiation oncology to discuss the role for palliative radiation.  There may be a role for bisphosphonates including Zometa down the line which I will discuss with him after final pathology results are back  Video visit with patient and his wife after PET CT scan is back   Thank you for this kind referral and the opportunity to participate in the care of this patient   Visit Diagnosis 1. Bone metastases ([place])     Dr. Randa Evens, MD, MPH [facility] [phone] 11/08/2019  4:21 PM

## 2019-11-15 ENCOUNTER — Telehealth: Payer: Self-pay | Admitting: *Deleted

## 2019-11-15 ENCOUNTER — Ambulatory Visit (HOSPITAL_COMMUNITY)
Admission: RE | Admit: 2019-11-15 | Discharge: 2019-11-15 | Disposition: A | Discharge status: Home / Self Care | Payer: Medicare Other | Source: Ambulatory Visit | Attending: Oncology | Admitting: Oncology

## 2019-11-15 ENCOUNTER — Other Ambulatory Visit: Payer: Self-pay

## 2019-11-15 DIAGNOSIS — C7951 Secondary malignant neoplasm of bone: Secondary | ICD-10-CM

## 2019-11-15 LAB — GLUCOSE, CAPILLARY: Glucose-Capillary: 95 mg/dL (ref 70–99)

## 2019-11-15 MED ORDER — FLUDEOXYGLUCOSE F - 18 (FDG) INJECTION
8.2000 | Freq: Once | INTRAVENOUS | Status: AC | PRN
  Administered 2019-11-15: 8.2 via INTRAVENOUS

## 2019-11-15 NOTE — Telephone Encounter (Addendum)
Called report  PET shows pathological fracture of T3 with 50% loss of height. Recommend dedicated spine imaging to further assess

## 2019-11-16 ENCOUNTER — Other Ambulatory Visit: Payer: Self-pay | Admitting: Oncology

## 2019-11-16 ENCOUNTER — Inpatient Hospital Stay (HOSPITAL_BASED_OUTPATIENT_CLINIC_OR_DEPARTMENT_OTHER): Payer: Medicare Other | Admitting: Oncology

## 2019-11-16 ENCOUNTER — Encounter: Payer: Self-pay | Admitting: Oncology

## 2019-11-16 DIAGNOSIS — C7951 Secondary malignant neoplasm of bone: Secondary | ICD-10-CM

## 2019-11-16 DIAGNOSIS — M4850XA Collapsed vertebra, not elsewhere classified, site unspecified, initial encounter for fracture: Secondary | ICD-10-CM | POA: Diagnosis not present

## 2019-11-16 DIAGNOSIS — S32402A Unspecified fracture of left acetabulum, initial encounter for closed fracture: Secondary | ICD-10-CM

## 2019-11-16 DIAGNOSIS — G893 Neoplasm related pain (acute) (chronic): Secondary | ICD-10-CM | POA: Diagnosis not present

## 2019-11-16 DIAGNOSIS — N529 Male erectile dysfunction, unspecified: Secondary | ICD-10-CM | POA: Insufficient documentation

## 2019-11-16 DIAGNOSIS — F419 Anxiety disorder, unspecified: Secondary | ICD-10-CM | POA: Insufficient documentation

## 2019-11-16 MED ORDER — CLONAZEPAM 0.5 MG PO TABS
0.5000 mg | ORAL_TABLET | Freq: Two times a day (BID) | ORAL | 0 refills | Status: DC | PRN
Start: 2019-11-16 — End: 2020-02-04

## 2019-11-16 MED ORDER — OXYCODONE HCL 5 MG PO TABS: 5.0000 mg | ORAL_TABLET | Freq: Four times a day (QID) | ORAL | 0 refills | Status: DC | PRN

## 2019-11-16 NOTE — Progress Notes (Signed)
Pt here to get results of scans and make plan for pt

## 2019-11-19 ENCOUNTER — Other Ambulatory Visit: Payer: Self-pay

## 2019-11-19 ENCOUNTER — Encounter: Payer: Self-pay | Admitting: Radiation Oncology

## 2019-11-19 ENCOUNTER — Ambulatory Visit: Payer: Medicare Other

## 2019-11-19 ENCOUNTER — Ambulatory Visit
Admission: RE | Admit: 2019-11-19 | Discharge: 2019-11-19 | Disposition: A | Discharge status: Home / Self Care | Payer: Medicare Other | Source: Ambulatory Visit | Attending: Radiation Oncology | Admitting: Radiation Oncology

## 2019-11-19 VITALS — BP 144/72 | HR 76 | Temp 96.9°F | Resp 16 | Wt 167.4 lb

## 2019-11-19 DIAGNOSIS — M899 Disorder of bone, unspecified: Secondary | ICD-10-CM | POA: Diagnosis not present

## 2019-11-19 DIAGNOSIS — C7951 Secondary malignant neoplasm of bone: Secondary | ICD-10-CM

## 2019-11-19 DIAGNOSIS — R972 Elevated prostate specific antigen [PSA]: Secondary | ICD-10-CM | POA: Diagnosis not present

## 2019-11-19 DIAGNOSIS — Z79899 Other long term (current) drug therapy: Secondary | ICD-10-CM | POA: Diagnosis not present

## 2019-11-19 DIAGNOSIS — M25552 Pain in left hip: Secondary | ICD-10-CM | POA: Insufficient documentation

## 2019-11-19 NOTE — Progress Notes (Signed)
I connected with Luisa Hart on 11/19/19 at  2:00 PM EDT by video enabled telemedicine visit and verified that I am speaking with the correct person using two identifiers.   I discussed the limitations, risks, security and privacy concerns of performing an evaluation and management service by telemedicine and the availability of in-person appointments. I also discussed with the patient that there may be a patient responsible charge related to this service. The patient expressed understanding and agreed to proceed.  Other persons participating in the visit and their role in the encounter:  Patients wife  Patient's location:  home Provider's location:  work  Risk analyst Complaint:  Discuss pet ct scan results  History of present illness: Patient is a 84 year old Caucasian gentleman who was initially seen by rheumatology Dr. Posey Pronto for symptoms of left hip pain which prompted x-rays followed by MRI of the left hip without contrast.  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  Patient was subsequently seen by orthopedics Dr. Pathology who did not recommend any orthopedic intervention for the left hip fracture he has been referred to oncology for further management patient's last PSA was checked in 2014 which was elevated at 7.4.  Patient is also a chronic smoker and smokes about 1 pack of cigarettes per day since 1968.  Head CT scan showed widespread bony metastatic disease with pathologic fracture of the left acetabulum.  Pathologic fracture of the T3 vertebral body with 50% loss of height possible fracture of the left seventh rib.  No evidence of visceral metastases.  PSA elevated at 35.  Interval history: Patient reports ongoing left hip pain which is not well controlled with tramadol.  However he is able to ambulate.  Denies any back pain at this time.  Reports  ongoing fatigue  Review of Systems  Constitutional: Positive for malaise/fatigue. Negative for chills, fever and weight loss.  HENT: Negative for congestion, ear discharge and nosebleeds.   Eyes: Negative for blurred vision.  Respiratory: Negative for cough, hemoptysis, sputum production, shortness of breath and wheezing.   Cardiovascular: Negative for chest pain, palpitations, orthopnea and claudication.  Gastrointestinal: Negative for abdominal pain, blood in stool, constipation, diarrhea, heartburn, melena, nausea and vomiting.  Genitourinary: Negative for dysuria, flank pain, frequency, hematuria and urgency.  Musculoskeletal: Negative for back pain, joint pain and myalgias.       Left hip pain  Skin: Negative for rash.  Neurological: Negative for dizziness, tingling, focal weakness, seizures, weakness and headaches.  Endo/Heme/Allergies: Does not bruise/bleed easily.  Psychiatric/Behavioral: Negative for depression and suicidal ideas. The patient does not have insomnia.     Allergies  Allergen Reactions  . Citalopram Other (See Comments)    Unsure but family remembers that he could not take the dru-not sure what happened    History reviewed. No pertinent past medical history.  History reviewed. No pertinent surgical history.  Social History   Socioeconomic History  . Marital status: Married    Spouse name: Not on file  . Number of children: Not on file  . Years of education: Not on file  . Highest education level: Not on file  Occupational History  . Not on file  Tobacco Use  . Smoking status: Current Every Day Smoker    Types: Pipe  . Smokeless tobacco: Never Used  Vaping Use  . Vaping Use: Never assessed  Substance and Sexual Activity  .  Alcohol use: Yes    Comment: maybe 1 beer a month  . Drug use: Not on file  . Sexual activity: Not on file  Other Topics Concern  . Not on file  Social History Narrative  . Not on file   Social Determinants of Health    Financial Resource Strain:   . Difficulty of Paying Living Expenses: Not on file  Food Insecurity:   . Worried About Charity fundraiser in the Last Year: Not on file  . Ran Out of Food in the Last Year: Not on file  Transportation Needs:   . Lack of Transportation (Medical): Not on file  . Lack of Transportation (Non-Medical): Not on file  Physical Activity:   . Days of Exercise per Week: Not on file  . Minutes of Exercise per Session: Not on file  Stress:   . Feeling of Stress : Not on file  Social Connections:   . Frequency of Communication with Friends and Family: Not on file  . Frequency of Social Gatherings with Friends and Family: Not on file  . Attends Religious Services: Not on file  . Active Member of Clubs or Organizations: Not on file  . Attends Archivist Meetings: Not on file  . Marital Status: Not on file  Intimate Partner Violence:   . Fear of Current or Ex-Partner: Not on file  . Emotionally Abused: Not on file  . Physically Abused: Not on file  . Sexually Abused: Not on file    No family history on file.   Current Outpatient Medications:  .  Cholecalciferol 125 MCG (5000 UT) capsule, Take 5,000 Units by mouth once a week., Disp: , Rfl:  .  clonazePAM (KLONOPIN) 0.5 MG tablet, Take 1 tablet (0.5 mg total) by mouth 2 (two) times daily as needed., Disp: 60 tablet, Rfl: 0 .  ibuprofen (ADVIL) 200 MG tablet, Take 200 mg by mouth 2 (two) times daily as needed., Disp: , Rfl:  .  lisinopril-hydrochlorothiazide (ZESTORETIC) 20-12.5 MG tablet, Take 1 tablet by mouth daily., Disp: , Rfl:  .  predniSONE (DELTASONE) 10 MG tablet, Take 5 mg by mouth daily., Disp: , Rfl:  .  Zinc 50 MG CAPS, Take 1 capsule by mouth daily., Disp: , Rfl:  .  oxyCODONE (OXY IR/ROXICODONE) 5 MG immediate release tablet, Take 1 tablet (5 mg total) by mouth every 6 (six) hours as needed for severe pain., Disp: 120 tablet, Rfl: 0  MR HIP LEFT WO CONTRAST  Result Date:  10/30/2019 CLINICAL DATA:  Low back and left thigh pain for 3 weeks. EXAM: MR OF THE LEFT HIP WITHOUT CONTRAST TECHNIQUE: Multiplanar, multisequence MR imaging was performed. No intravenous contrast was administered. COMPARISON:  Radiographs 10/22/2019 are not available for comparison. FINDINGS: Diffuse metastatic bone disease is demonstrated with numerous low T1 and high T2 signal intensity lesions involving the lower lumbar spine, pelvis and both hips. There is a infiltrating lesion involving the left acetabulum with a pathologic stress or insufficiency fracture likely causing the patient's left hip pain. No hip fracture. No significant intrapelvic abnormalities are identified. The prostate gland is grossly normal. No obvious mass but prostate cancer would certainly be a consideration. No pelvic adenopathy is identified. Hardware noted on the left femur with associated artifact. IMPRESSION: 1. Diffuse metastatic bone disease involving the lower lumbar spine, pelvis and both hips. Recommend oncology referral for metastatic workup. 2. Pathologic stress or insufficiency fracture involving the left acetabulum likely causing the patient's left hip  pain. 3. No significant intrapelvic abnormalities. Electronically Signed   By: Marijo Sanes M.D.   On: 10/30/2019 06:49   NM PET Image Initial (PI) Skull Base To Thigh  Result Date: 11/15/2019 CLINICAL DATA:  Initial treatment strategy for findings of bone metastases on recent MRI from unknown primary, subsequently found to have elevated PSA. EXAM: NUCLEAR MEDICINE PET SKULL BASE TO THIGH TECHNIQUE: 8.2 mCi F-18 FDG was injected intravenously. Full-ring PET imaging was performed from the skull base to thigh after the radiotracer. CT data was obtained and used for attenuation correction and anatomic localization. Fasting blood glucose: 95 mg/dl COMPARISON:  MRI of the hip of October 29, 2019 FINDINGS: Mediastinal blood pool activity: SUV max 3.65 Liver activity: SUV max NA  NECK: No hypermetabolic lymph nodes in the neck. Incidental CT findings: none CHEST: Single small lymph node, high RIGHT paratracheal unchanged with respect to size dating back to 2009 (SUVmax = 3.2) (image 49, series 4) no adenopathy by size criteria or other lymph nodes with increased metabolic activity. Incidental CT findings: No suspicious pulmonary nodule. No consolidation. No pleural effusion. Airways are patent. Calcified atheromatous plaque of the thoracic aorta. Normal heart size. Calcified coronary artery disease. No pericardial effusion. ABDOMEN/PELVIS: No abnormal hypermetabolic activity within the liver, pancreas, adrenal glands, or spleen. No hypermetabolic lymph nodes in the abdomen or pelvis. Heterogeneous uptake in the prostate volume is a [place] nonspecific is seen in the setting of elevated PSA. Maximum SUV of 4.5 Incidental CT findings: No solid visceral abnormality. The adrenal glands are normal. No acute gastrointestinal process. Calcified plaque in the abdominal aorta without aneurysmal dilation. No adenopathy by size criteria in the pelvis or in the abdomen. SKELETON: Signs of ORIF of the LEFT proximal femur. LEFT proximal femoral metastasis (image 190, series 4) added density in the medullary space of the LEFT proximal femur with maximum SUV of 6.4. Subtle sclerosis in the RIGHT hemi sacrum associated with increased metabolic activity (image 025, series 4) (SUVmax = 6.4 this is the most pronounced area of sacral uptake but with diffuse uptake as described below. Bilateral iliac and diffuse sacral uptake compatible with known lesions in these locations. Destructive lesion of the LEFT acetabulum (image 165, series 4) pathologic fracture associated with this area as seen on recent MRI (SUVmax = 6.5) Increased metabolic activity in the sternum. Sternal lesion also with subtle sclerosis on image 67 of series 4 but with more widespread hypermetabolic changes in the sternum. Hypermetabolic focus along  the LEFT lateral seventh rib with pathologic fracture. Also with subtle increased metabolic activity in the LEFT posterior seventh rib and in the LEFT eighth rib. Subtle increased metabolic activity associated with the RIGHT lateral third rib. Multilevel increased metabolic activity in the spine, involving the thoracic and lumbar spine. Bilateral ischial involvement. RIGHT femoral involvement. Loss of height at T3 and T4 with marked hypermetabolic activity at the T3 level (SUVmax = 8.2. T3 with approximately 50% loss of height likely related to pathologic fracture. Every level of the thoracic and lumbar spine shows heterogeneous involvement. Another index lesion in the spine at L1 shows metabolic activity with maximum SUV of 6.8 Uptake about the LEFT shoulder could be related to degenerative changes. Incidental CT findings: Spinal degenerative changes. IMPRESSION: 1. Widespread bony metastatic disease presumably related to prostate cancer given the patient's PSA. 2. Pathologic fractures in the LEFT acetabulum and also suspected at T3 and in LEFT seventh rib as described. T3 fracture may have as much as 50% loss  of height. 3. Presumed degenerative changes about the LEFT shoulder but with other areas of diffuse bony metastatic disease with suggest close attention to this area on subsequent imaging 4. Small mildly hypermetabolic lymph node in the superior mediastinum along the RIGHT paratracheal chain is actually smaller than on the study of 2009 and may be reactive in this patient with patulous esophagus, potentially related to esophagitis. Attention on follow-up and with dedicated esophageal assessment based on symptoms. These results will be called to the ordering clinician or representative by the Radiologist Assistant, and communication documented in the PACS or [employer]. Electronically Signed   By: Zetta Bills M.D.   On: 11/15/2019 16:11    No images are attached to the encounter.   CMP Latest  Ref Rng & Units 11/08/2019  Glucose 70 - 99 mg/dL 111(H)  BUN 8 - 23 mg/dL 38(H)  Creatinine 0.61 - 1.24 mg/dL 1.41(H)  Sodium 135 - 145 mmol/L 135  Potassium 3.5 - 5.1 mmol/L 5.1  Chloride 98 - 111 mmol/L 101  CO2 22 - 32 mmol/L 26  Calcium 8.9 - 10.3 mg/dL 9.3  Total Protein 6.5 - 8.1 g/dL 8.2(H)  Total Bilirubin 0.3 - 1.2 mg/dL 0.7  Alkaline Phos 38 - 126 U/L 105  AST 15 - 41 U/L 20  ALT 0 - 44 U/L 17   CBC Latest Ref Rng & Units 11/08/2019  WBC 4.0 - 10.5 K/uL 9.5  Hemoglobin 13.0 - 17.0 g/dL 12.1(L)  Hematocrit 39 - 52 % 35.1(L)  Platelets 150 - 400 K/uL 175     Observation/objective: Appears in no acute distress over video visit today.  Breathing is nonlabored  Assessment and plan: Patient is a 84 year old male with diffuse bone metastatic disease probably prostate primary  I have reviewed PET/CT scan images independently and discussed findings with the patient.  PET CT scan does not show any evidence of visceral metastases or primary lung mass.  He does have diffuse bone metastases as well as elevated PSA of 35 probably indicating prostate cancer.  Next step will be to obtain a tissue biopsy.  I did discuss his PET CT scan findings with Dr. Cari Caraway from [facility] who has reviewed his MRI.  At this time if he does not have any significant back pain there would be no role for kyphoplasty which can give [place] a tissue diagnosis.  I am proceeding with MRI of the thoracic and lumbar spine at this time but will hold off on getting a kyphoplasty.  Patient has already been seen by orthopedics and there is no plan for immediate hip fixation.  He will be seeing Dr. Donella Stade to discuss palliative radiation.  I will switch him from tramadol to oxycodone 5 mg every 6 hours as needed I have asked him to reduce the usage of ibuprofen as well.  I have also discussed patient's case with interventional radiology and a CT-guided biopsy in the area of the right ilium has been deemed to be possible.  I  will schedule that  Follow-up instructions: I will tentatively see the patient back in 10 days time to discuss the results of biopsy and possible Lupron if this turns out to be metastatic prostate cancer  I discussed the assessment and treatment plan with the patient. The patient was provided an opportunity to ask questions and all were answered. The patient agreed with the plan and demonstrated an understanding of the instructions.   The patient was advised to call back or seek an in-person  evaluation if the symptoms worsen or if the condition fails to improve as anticipated.   Visit Diagnosis: 1. Bone metastases ([place])   2. Neoplasm related pain   3. Closed nondisplaced fracture of left acetabulum, unspecified portion of acetabulum, initial encounter ([place])   4. Pathologic compression fracture of spine, initial encounter [facility])     Dr. Randa Evens, MD, MPH [facility] Tel- [phone] 11/19/2019 7:59 AM

## 2019-11-19 NOTE — Consult Note (Signed)
NEW PATIENT EVALUATION  Name: Alexis Cruz  MRN: [id number]  Date:   11/19/2019     DOB: [birth year]   This 84 y.o. male patient presents to the clinic for initial evaluation of left hip pain and patient with probable stage IV prostate cancer.  REFERRING PHYSICIAN: Sindy Guadeloupe, MD  CHIEF COMPLAINT:  Chief Complaint  Patient presents with  . Cancer    Initial consultation    DIAGNOSIS: The encounter diagnosis was Bone metastasis ([place]).   PREVIOUS INVESTIGATIONS:  PET CT scan and MRI of left hip both reviewed Clinical notes reviewed Pathology pending  HPI: Patient is a 84 year old male who is presented with chronic left hip pain.  Work-up including MRI of the left hip showed diffuse metastatic bone involvement of the lower lumbar spine pelvis and both hips.  He also had a pathologic stress in fish insufficiency fracture of the left acetabulum causing the patient's left hip pain.  Patient elevated PSA of 35 is being worked up for possible stage IV prostate cancer.  PET CT scan demonstrated widespread red bony metastatic disease pathologic fracture left acetabulum.  He is also had an MRI of the lumbar and thoracic spine ordered as well as a CT biopsy for the eighth.  He is seen today for consideration of palliative radiation therapy to his left hip.  He cannot ambulate without significant pain.  He really complains of no significant back or other bony site pain.  PLANNED TREATMENT REGIMEN: Palliative radiation therapy to left hip  PAST MEDICAL HISTORY:  has no past medical history on file.    PAST SURGICAL HISTORY: History reviewed. No pertinent surgical history.  FAMILY HISTORY: family history is not on file.  SOCIAL HISTORY:  reports that he has been smoking pipe. He has never used smokeless tobacco. He reports current alcohol use.  ALLERGIES: Citalopram  MEDICATIONS:  Current Outpatient Medications  Medication Sig Dispense Refill  . Cholecalciferol 125 MCG (5000 UT)  capsule Take 5,000 Units by mouth once a week.    . clonazePAM (KLONOPIN) 0.5 MG tablet Take 1 tablet (0.5 mg total) by mouth 2 (two) times daily as needed. 60 tablet 0  . ibuprofen (ADVIL) 200 MG tablet Take 200 mg by mouth 2 (two) times daily as needed.    Marland Kitchen lisinopril-hydrochlorothiazide (ZESTORETIC) 20-12.5 MG tablet Take 1 tablet by mouth daily.    Marland Kitchen oxyCODONE (OXY IR/ROXICODONE) 5 MG immediate release tablet Take 1 tablet (5 mg total) by mouth every 6 (six) hours as needed for severe pain. 120 tablet 0  . predniSONE (DELTASONE) 10 MG tablet Take 5 mg by mouth daily.    . Zinc 50 MG CAPS Take 1 capsule by mouth daily.     No current facility-administered medications for this encounter.    ECOG PERFORMANCE STATUS:  0 - Asymptomatic  REVIEW OF SYSTEMS: Patient denies any weight loss, fatigue, weakness, fever, chills or night sweats. Patient denies any loss of vision, blurred vision. Patient denies any ringing  of the ears or hearing loss. No irregular heartbeat. Patient denies heart murmur or history of fainting. Patient denies any chest pain or pain radiating to her upper extremities. Patient denies any shortness of breath, difficulty breathing at night, cough or hemoptysis. Patient denies any swelling in the lower legs. Patient denies any nausea vomiting, vomiting of blood, or coffee ground material in the vomitus. Patient denies any stomach pain. Patient states has had normal bowel movements no significant constipation or diarrhea. Patient denies any dysuria, hematuria  or significant nocturia. Patient denies any problems walking, swelling in the joints or loss of balance. Patient denies any skin changes, loss of hair or loss of weight. Patient denies any excessive worrying or anxiety or significant depression. Patient denies any problems with insomnia. Patient denies excessive thirst, polyuria, polydipsia. Patient denies any swollen glands, patient denies easy bruising or easy bleeding. Patient  denies any recent infections, allergies or URI. Patient "s visual fields have not changed significantly in recent time.   PHYSICAL EXAM: BP (!) 144/72 (BP Location: Right Arm, Patient Position: Sitting, Cuff Size: Normal)   Pulse 76   Temp (!) 96.9 F (36.1 C) (Tympanic)   Resp 16   Wt 167 lb 6.4 oz (75.9 kg)   BMI 27.86 kg/m  Range of motion his left lower extremity does not elicit pain.  Motor or sensory and DTR levels are equal and symmetric in lower extremities.  Well-developed well-nourished patient in NAD. HEENT reveals PERLA, EOMI, discs not visualized.  Oral cavity is clear. No oral mucosal lesions are identified. Neck is clear without evidence of cervical or supraclavicular adenopathy. Lungs are clear to A&P. Cardiac examination is essentially unremarkable with regular rate and rhythm without murmur rub or thrill. Abdomen is benign with no organomegaly or masses noted. Motor sensory and DTR levels are equal and symmetric in the upper and lower extremities. Cranial nerves II through XII are grossly intact. Proprioception is intact. No peripheral adenopathy or edema is identified. No motor or sensory levels are noted. Crude visual fields are within normal range.  LABORATORY DATA: Pathology pending labs reviewed    RADIOLOGY RESULTS: MRI of left hip as well as PET scan reviewed compatible with above-stated findings   IMPRESSION: Widespread bony metastasis from probable prostate cancer in 84 year old male  PLAN: This time work-up including MRI scans of his spine as well as biopsy of his initial tumor are pending.  I believe we can go ahead with simulation and treatment planning for palliative radiation therapy to his left hip.  We will plan on delivering 3000 cGy in 10 fractions I will track down into the proximal femur since PET scan shows probable involvement of the the shaft of the femur.  Risks and benefits of treatment occluding skin reaction fatigue all were discussed in detail with  the patient and his wife have personally set up and ordered CT simulation for tomorrow we will plan on starting his treatments right after Labor Day weekend.  I would like to take this opportunity to thank you for allowing me to participate in the care of your patient.Noreene Filbert, MD

## 2019-11-20 ENCOUNTER — Ambulatory Visit
Admission: RE | Admit: 2019-11-20 | Discharge: 2019-11-20 | Disposition: A | Discharge status: Home / Self Care | Payer: Medicare Other | Source: Ambulatory Visit | Attending: Radiation Oncology | Admitting: Radiation Oncology

## 2019-11-20 DIAGNOSIS — C61 Malignant neoplasm of prostate: Secondary | ICD-10-CM | POA: Insufficient documentation

## 2019-11-20 DIAGNOSIS — C7951 Secondary malignant neoplasm of bone: Secondary | ICD-10-CM | POA: Insufficient documentation

## 2019-11-21 DIAGNOSIS — C7951 Secondary malignant neoplasm of bone: Secondary | ICD-10-CM | POA: Insufficient documentation

## 2019-11-21 DIAGNOSIS — C61 Malignant neoplasm of prostate: Secondary | ICD-10-CM | POA: Diagnosis present

## 2019-11-22 ENCOUNTER — Encounter: Payer: Self-pay | Admitting: Oncology

## 2019-11-23 ENCOUNTER — Other Ambulatory Visit: Payer: Self-pay

## 2019-11-23 MED ORDER — OXYCODONE HCL ER 10 MG PO T12A
10.0000 mg | EXTENDED_RELEASE_TABLET | Freq: Two times a day (BID) | ORAL | 0 refills | Status: DC
Start: 2019-11-23 — End: 2019-11-27

## 2019-11-27 ENCOUNTER — Other Ambulatory Visit: Payer: Self-pay

## 2019-11-27 ENCOUNTER — Ambulatory Visit
Admission: RE | Admit: 2019-11-27 | Discharge: 2019-11-27 | Disposition: A | Discharge status: Home / Self Care | Payer: Medicare Other | Source: Ambulatory Visit | Attending: Radiation Oncology | Admitting: Radiation Oncology

## 2019-11-27 DIAGNOSIS — C7951 Secondary malignant neoplasm of bone: Secondary | ICD-10-CM | POA: Diagnosis not present

## 2019-11-27 MED ORDER — XTAMPZA ER 9 MG PO C12A
9.0000 mg | EXTENDED_RELEASE_CAPSULE | Freq: Two times a day (BID) | ORAL | 0 refills | Status: DC
Start: 2019-11-27 — End: 2019-12-06

## 2019-11-28 ENCOUNTER — Ambulatory Visit
Admission: RE | Admit: 2019-11-28 | Discharge: 2019-11-28 | Disposition: A | Discharge status: Home / Self Care | Payer: Medicare Other | Source: Ambulatory Visit | Attending: Oncology | Admitting: Oncology

## 2019-11-28 ENCOUNTER — Other Ambulatory Visit: Payer: Self-pay

## 2019-11-28 ENCOUNTER — Ambulatory Visit
Admission: RE | Admit: 2019-11-28 | Discharge: 2019-11-28 | Disposition: A | Discharge status: Home / Self Care | Payer: Medicare Other | Source: Ambulatory Visit | Attending: Radiation Oncology | Admitting: Radiation Oncology

## 2019-11-28 ENCOUNTER — Ambulatory Visit: Admission: RE | Admit: 2019-11-28 | Payer: Medicare Other | Source: Ambulatory Visit

## 2019-11-28 DIAGNOSIS — I10 Essential (primary) hypertension: Secondary | ICD-10-CM | POA: Diagnosis not present

## 2019-11-28 DIAGNOSIS — M25552 Pain in left hip: Secondary | ICD-10-CM | POA: Diagnosis not present

## 2019-11-28 DIAGNOSIS — I2510 Atherosclerotic heart disease of native coronary artery without angina pectoris: Secondary | ICD-10-CM | POA: Diagnosis not present

## 2019-11-28 DIAGNOSIS — F17210 Nicotine dependence, cigarettes, uncomplicated: Secondary | ICD-10-CM | POA: Diagnosis not present

## 2019-11-28 DIAGNOSIS — Z79899 Other long term (current) drug therapy: Secondary | ICD-10-CM | POA: Insufficient documentation

## 2019-11-28 DIAGNOSIS — M549 Dorsalgia, unspecified: Secondary | ICD-10-CM | POA: Insufficient documentation

## 2019-11-28 DIAGNOSIS — Z7901 Long term (current) use of anticoagulants: Secondary | ICD-10-CM | POA: Diagnosis not present

## 2019-11-28 DIAGNOSIS — C7951 Secondary malignant neoplasm of bone: Secondary | ICD-10-CM | POA: Diagnosis not present

## 2019-11-28 HISTORY — DX: Essential (primary) hypertension: I10

## 2019-11-28 LAB — CBC
HCT: 35.6 % — ABNORMAL LOW (ref 39.0–52.0)
Hemoglobin: 11.8 g/dL — ABNORMAL LOW (ref 13.0–17.0)
MCH: 30.2 pg (ref 26.0–34.0)
MCHC: 33.1 g/dL (ref 30.0–36.0)
MCV: 91 fL (ref 80.0–100.0)
Platelets: 203 10*3/uL (ref 150–400)
RBC: 3.91 MIL/uL — ABNORMAL LOW (ref 4.22–5.81)
RDW: 15 % (ref 11.5–15.5)
WBC: 9 10*3/uL (ref 4.0–10.5)
nRBC: 0 % (ref 0.0–0.2)

## 2019-11-28 LAB — PROTIME-INR
INR: 1 (ref 0.8–1.2)
Prothrombin Time: 12.9 seconds (ref 11.4–15.2)

## 2019-11-28 MED ORDER — FENTANYL CITRATE (PF) 100 MCG/2ML IJ SOLN
INTRAMUSCULAR | Status: AC
  Filled 2019-11-28: qty 2

## 2019-11-28 MED ORDER — FENTANYL CITRATE (PF) 100 MCG/2ML IJ SOLN
INTRAMUSCULAR | Status: AC | PRN
  Administered 2019-11-28: 50 ug via INTRAVENOUS

## 2019-11-28 MED ORDER — MIDAZOLAM HCL 2 MG/2ML IJ SOLN
INTRAMUSCULAR | Status: AC
  Filled 2019-11-28: qty 2

## 2019-11-28 MED ORDER — MIDAZOLAM HCL 2 MG/2ML IJ SOLN
INTRAMUSCULAR | Status: AC | PRN
  Administered 2019-11-28: 1 mg via INTRAVENOUS

## 2019-11-28 MED ORDER — SODIUM CHLORIDE 0.9 % IV SOLN: INTRAVENOUS | Status: DC

## 2019-11-28 NOTE — Progress Notes (Signed)
Patient post Ileum biopsy per DR Pascal Lux, tolerated well. Dressing dry/intact. Vitals stable pre and post procedure. Awake/alert and oriented post procedure. Denies complaints at this time. Received Versed 1 mg along with Fentanyl 60mcg IV for procedure. Report given to [facility] with questions answered.

## 2019-11-28 NOTE — Consult Note (Signed)
Chief Complaint: Osseous metastatic disease of unknown primary.  Referring Physician(s): Rao,Archana C  Patient Status: [facility] - Out-pt  History of Present Illness: Alexis Cruz is a 84 y.o. male with past medical history significant for hypertension and smoking who presents today for CT-guided biopsy bone lesion biopsy.  Patient continues to complain of left-sided hip and back pain though states that his left hip has improved with since the initiation of external radiation yesterday.  He is otherwise without complaint.  Specifically, no chest pain cough or shortness of breath.  No fever or chills.   Past Medical History:  Diagnosis Date  . Hypertension     History reviewed. No pertinent surgical history.  Allergies: Citalopram  Medications: Prior to Admission medications   Medication Sig Start Date End Date Taking? Authorizing Provider  Cholecalciferol 125 MCG (5000 UT) capsule Take 5,000 Units by mouth once a week. 08/07/18 08/07/22 Yes [provider]  clonazePAM (KLONOPIN) 0.5 MG tablet Take 1 tablet (0.5 mg total) by mouth 2 (two) times daily as needed. 11/16/19  Yes Sindy Guadeloupe, MD  ibuprofen (ADVIL) 200 MG tablet Take 200 mg by mouth 2 (two) times daily as needed.   Yes [provider]  lisinopril-hydrochlorothiazide (ZESTORETIC) 20-12.5 MG tablet Take 1 tablet by mouth daily. 11/01/16  Yes [provider]  oxyCODONE (OXY IR/ROXICODONE) 5 MG immediate release tablet Take 1 tablet (5 mg total) by mouth every 6 (six) hours as needed for severe pain. 11/16/19  Yes Sindy Guadeloupe, MD  oxyCODONE ER [facility]) 9 MG C12A Take 9 mg by mouth every 12 (twelve) hours. 11/27/19 12/27/19 Yes Sindy Guadeloupe, MD  predniSONE (DELTASONE) 10 MG tablet Take 5 mg by mouth daily.   Yes [provider]  Zinc 50 MG CAPS Take 1 capsule by mouth daily.   Yes [provider]     History reviewed. No pertinent family history.  Social History    Socioeconomic History  . Marital status: Married    Spouse name: Not on file  . Number of children: Not on file  . Years of education: Not on file  . Highest education level: Not on file  Occupational History  . Not on file  Tobacco Use  . Smoking status: Current Every Day Smoker    Types: Pipe  . Smokeless tobacco: Never Used  Vaping Use  . Vaping Use: Never used  Substance and Sexual Activity  . Alcohol use: Yes    Comment: maybe 1 beer a month  . Drug use: Never  . Sexual activity: Not on file  Other Topics Concern  . Not on file  Social History Narrative  . Not on file   Social Determinants of Health   Financial Resource Strain:   . Difficulty of Paying Living Expenses: Not on file  Food Insecurity:   . Worried About Charity fundraiser in the Last Year: Not on file  . Ran Out of Food in the Last Year: Not on file  Transportation Needs:   . Lack of Transportation (Medical): Not on file  . Lack of Transportation (Non-Medical): Not on file  Physical Activity:   . Days of Exercise per Week: Not on file  . Minutes of Exercise per Session: Not on file  Stress:   . Feeling of Stress : Not on file  Social Connections:   . Frequency of Communication with Friends and Family: Not on file  . Frequency of Social Gatherings with Friends and  Family: Not on file  . Attends Religious Services: Not on file  . Active Member of Clubs or Organizations: Not on file  . Attends Archivist Meetings: Not on file  . Marital Status: Not on file    ECOG Status: 1 - Symptomatic but completely ambulatory  Review of Systems: A 12 point ROS discussed and pertinent positives are indicated in the HPI above.  All other systems are negative.  Review of Systems  Vital Signs: BP (!) 167/62   Pulse 84   Temp 97.6 F (36.4 C) (Oral)   Resp (!) 24   Ht 5\' 5"  (1.651 m)   Wt 74.8 kg   SpO2 98%   BMI 27.46 kg/m   Physical Exam  Imaging: MR HIP LEFT WO CONTRAST  Result  Date: 10/30/2019 CLINICAL DATA:  Low back and left thigh pain for 3 weeks. EXAM: MR OF THE LEFT HIP WITHOUT CONTRAST TECHNIQUE: Multiplanar, multisequence MR imaging was performed. No intravenous contrast was administered. COMPARISON:  Radiographs 10/22/2019 are not available for comparison. FINDINGS: Diffuse metastatic bone disease is demonstrated with numerous low T1 and high T2 signal intensity lesions involving the lower lumbar spine, pelvis and both hips. There is a infiltrating lesion involving the left acetabulum with a pathologic stress or insufficiency fracture likely causing the patient's left hip pain. No hip fracture. No significant intrapelvic abnormalities are identified. The prostate gland is grossly normal. No obvious mass but prostate cancer would certainly be a consideration. No pelvic adenopathy is identified. Hardware noted on the left femur with associated artifact. IMPRESSION: 1. Diffuse metastatic bone disease involving the lower lumbar spine, pelvis and both hips. Recommend oncology referral for metastatic workup. 2. Pathologic stress or insufficiency fracture involving the left acetabulum likely causing the patient's left hip pain. 3. No significant intrapelvic abnormalities. Electronically Signed   By: Marijo Sanes M.D.   On: 10/30/2019 06:49   NM PET Image Initial (PI) Skull Base To Thigh  Result Date: 11/15/2019 CLINICAL DATA:  Initial treatment strategy for findings of bone metastases on recent MRI from unknown primary, subsequently found to have elevated PSA. EXAM: NUCLEAR MEDICINE PET SKULL BASE TO THIGH TECHNIQUE: 8.2 mCi F-18 FDG was injected intravenously. Full-ring PET imaging was performed from the skull base to thigh after the radiotracer. CT data was obtained and used for attenuation correction and anatomic localization. Fasting blood glucose: 95 mg/dl COMPARISON:  MRI of the hip of October 29, 2019 FINDINGS: Mediastinal blood pool activity: SUV max 3.65 Liver activity: SUV  max NA NECK: No hypermetabolic lymph nodes in the neck. Incidental CT findings: none CHEST: Single small lymph node, high RIGHT paratracheal unchanged with respect to size dating back to 2009 (SUVmax = 3.2) (image 49, series 4) no adenopathy by size criteria or other lymph nodes with increased metabolic activity. Incidental CT findings: No suspicious pulmonary nodule. No consolidation. No pleural effusion. Airways are patent. Calcified atheromatous plaque of the thoracic aorta. Normal heart size. Calcified coronary artery disease. No pericardial effusion. ABDOMEN/PELVIS: No abnormal hypermetabolic activity within the liver, pancreas, adrenal glands, or spleen. No hypermetabolic lymph nodes in the abdomen or pelvis. Heterogeneous uptake in the prostate volume is a Lea nonspecific is seen in the setting of elevated PSA. Maximum SUV of 4.5 Incidental CT findings: No solid visceral abnormality. The adrenal glands are normal. No acute gastrointestinal process. Calcified plaque in the abdominal aorta without aneurysmal dilation. No adenopathy by size criteria in the pelvis or in the abdomen. SKELETON: Signs of  ORIF of the LEFT proximal femur. LEFT proximal femoral metastasis (image 190, series 4) added density in the medullary space of the LEFT proximal femur with maximum SUV of 6.4. Subtle sclerosis in the RIGHT hemi sacrum associated with increased metabolic activity (image 161, series 4) (SUVmax = 6.4 this is the most pronounced area of sacral uptake but with diffuse uptake as described below. Bilateral iliac and diffuse sacral uptake compatible with known lesions in these locations. Destructive lesion of the LEFT acetabulum (image 165, series 4) pathologic fracture associated with this area as seen on recent MRI (SUVmax = 6.5) Increased metabolic activity in the sternum. Sternal lesion also with subtle sclerosis on image 67 of series 4 but with more widespread hypermetabolic changes in the sternum. Hypermetabolic  focus along the LEFT lateral seventh rib with pathologic fracture. Also with subtle increased metabolic activity in the LEFT posterior seventh rib and in the LEFT eighth rib. Subtle increased metabolic activity associated with the RIGHT lateral third rib. Multilevel increased metabolic activity in the spine, involving the thoracic and lumbar spine. Bilateral ischial involvement. RIGHT femoral involvement. Loss of height at T3 and T4 with marked hypermetabolic activity at the T3 level (SUVmax = 8.2. T3 with approximately 50% loss of height likely related to pathologic fracture. Every level of the thoracic and lumbar spine shows heterogeneous involvement. Another index lesion in the spine at L1 shows metabolic activity with maximum SUV of 6.8 Uptake about the LEFT shoulder could be related to degenerative changes. Incidental CT findings: Spinal degenerative changes. IMPRESSION: 1. Widespread bony metastatic disease presumably related to prostate cancer given the patient's PSA. 2. Pathologic fractures in the LEFT acetabulum and also suspected at T3 and in LEFT seventh rib as described. T3 fracture may have as much as 50% loss of height. 3. Presumed degenerative changes about the LEFT shoulder but with other areas of diffuse bony metastatic disease with suggest close attention to this area on subsequent imaging 4. Small mildly hypermetabolic lymph node in the superior mediastinum along the RIGHT paratracheal chain is actually smaller than on the study of 2009 and may be reactive in this patient with patulous esophagus, potentially related to esophagitis. Attention on follow-up and with dedicated esophageal assessment based on symptoms. These results will be called to the ordering clinician or representative by the Radiologist Assistant, and communication documented in the PACS or [employer]. Electronically Signed   By: Zetta Bills M.D.   On: 11/15/2019 16:11    Labs:  CBC: Recent Labs    11/08/19 1201  11/28/19 1050  WBC 9.5 9.0  HGB 12.1* 11.8*  HCT 35.1* 35.6*  PLT 175 203    COAGS: Recent Labs    11/28/19 1050  INR 1.0    BMP: Recent Labs    11/08/19 1201  NA 135  K 5.1  CL 101  CO2 26  GLUCOSE 111*  BUN 38*  CALCIUM 9.3  CREATININE 1.41*  GFRNONAA 46*  GFRAA 54*    LIVER FUNCTION TESTS: Recent Labs    11/08/19 1201  BILITOT 0.7  AST 20  ALT 17  ALKPHOS 105  PROT 8.2*  ALBUMIN 4.5    TUMOR MARKERS: No results for input(s): AFPTM, CEA, CA199, CHROMGRNA in the last 8760 hours.  Assessment and Plan:  Alexis Cruz is a 84 y.o. male with past medical history significant for hypertension and smoking who presents today for CT-guided biopsy bone lesion biopsy.  Patient continues to complain of left-sided hip and back pain though  states that his left hip has improved with since the initiation of external radiation yesterday.  He is otherwise without complaint.    Risks and benefits of CT guided right iliac lesion biopsy was discussed with the patient and/or patient's family including, but not limited to bleeding, infection, damage to adjacent structures or low yield requiring additional tests.  All of the questions were answered and there is agreement to proceed.  Consent signed and in chart.   Thank you for this interesting consult.  I greatly enjoyed meeting Alexis Cruz and look forward to participating in their care.  A copy of this report was sent to the requesting provider on this date.  Electronically Signed: Sandi Mariscal, MD 11/28/2019, 11:32 AM   I spent a total of 15 Minutes in face to face in clinical consultation, greater than 50% of which was counseling/coordinating care for osseous metastatic disease of unknown primary

## 2019-11-28 NOTE — Discharge Instructions (Signed)

## 2019-11-28 NOTE — Procedures (Signed)
Pre procedural Dx: Osseous metastatic disease of unknown primary  Post procedural Dx: Same  Technically successful CT guided biopsy of hypermetabolic lesion within the right ilium.    EBL: None.   Complications: None immediate.   Ronny Bacon, MD Pager #: [phone]

## 2019-11-29 ENCOUNTER — Ambulatory Visit
Admission: RE | Admit: 2019-11-29 | Discharge: 2019-11-29 | Disposition: A | Discharge status: Home / Self Care | Payer: Medicare Other | Source: Ambulatory Visit | Attending: Radiation Oncology | Admitting: Radiation Oncology

## 2019-11-29 DIAGNOSIS — C7951 Secondary malignant neoplasm of bone: Secondary | ICD-10-CM | POA: Diagnosis not present

## 2019-11-30 ENCOUNTER — Other Ambulatory Visit: Payer: Self-pay | Admitting: Anatomic Pathology & Clinical Pathology

## 2019-11-30 ENCOUNTER — Ambulatory Visit
Admission: RE | Admit: 2019-11-30 | Discharge: 2019-11-30 | Disposition: A | Discharge status: Home / Self Care | Payer: Medicare Other | Source: Ambulatory Visit | Attending: Radiation Oncology | Admitting: Radiation Oncology

## 2019-11-30 ENCOUNTER — Other Ambulatory Visit: Payer: Self-pay | Admitting: Oncology

## 2019-11-30 DIAGNOSIS — Z191 Hormone sensitive malignancy status: Secondary | ICD-10-CM | POA: Insufficient documentation

## 2019-11-30 DIAGNOSIS — C7951 Secondary malignant neoplasm of bone: Secondary | ICD-10-CM | POA: Diagnosis not present

## 2019-11-30 DIAGNOSIS — C61 Malignant neoplasm of prostate: Secondary | ICD-10-CM

## 2019-11-30 LAB — SURGICAL PATHOLOGY

## 2019-12-03 ENCOUNTER — Ambulatory Visit
Admission: RE | Admit: 2019-12-03 | Discharge: 2019-12-03 | Disposition: A | Discharge status: Home / Self Care | Payer: Medicare Other | Source: Ambulatory Visit | Attending: Radiation Oncology | Admitting: Radiation Oncology

## 2019-12-03 DIAGNOSIS — C7951 Secondary malignant neoplasm of bone: Secondary | ICD-10-CM | POA: Diagnosis not present

## 2019-12-04 ENCOUNTER — Other Ambulatory Visit: Payer: Self-pay

## 2019-12-04 ENCOUNTER — Encounter: Payer: Self-pay | Admitting: Oncology

## 2019-12-04 ENCOUNTER — Inpatient Hospital Stay: Payer: Medicare Other | Attending: Oncology | Admitting: Oncology

## 2019-12-04 ENCOUNTER — Ambulatory Visit
Admission: RE | Admit: 2019-12-04 | Discharge: 2019-12-04 | Disposition: A | Discharge status: Home / Self Care | Payer: Medicare Other | Source: Ambulatory Visit | Attending: Radiation Oncology | Admitting: Radiation Oncology

## 2019-12-04 ENCOUNTER — Telehealth: Payer: Self-pay | Admitting: Pharmacist

## 2019-12-04 ENCOUNTER — Inpatient Hospital Stay: Payer: Medicare Other

## 2019-12-04 VITALS — BP 143/55 | HR 74 | Temp 97.2°F | Resp 16 | Wt 165.5 lb

## 2019-12-04 DIAGNOSIS — C61 Malignant neoplasm of prostate: Secondary | ICD-10-CM | POA: Insufficient documentation

## 2019-12-04 DIAGNOSIS — G893 Neoplasm related pain (acute) (chronic): Secondary | ICD-10-CM | POA: Diagnosis not present

## 2019-12-04 DIAGNOSIS — Z5111 Encounter for antineoplastic chemotherapy: Secondary | ICD-10-CM | POA: Diagnosis not present

## 2019-12-04 DIAGNOSIS — Z7189 Other specified counseling: Secondary | ICD-10-CM

## 2019-12-04 DIAGNOSIS — Z191 Hormone sensitive malignancy status: Secondary | ICD-10-CM

## 2019-12-04 DIAGNOSIS — C7951 Secondary malignant neoplasm of bone: Secondary | ICD-10-CM

## 2019-12-04 MED ORDER — DEGARELIX ACETATE(240 MG DOSE) 120 MG/VIAL ~~LOC~~ SOLR
240.0000 mg | Freq: Once | SUBCUTANEOUS | Status: AC
  Administered 2019-12-04: 240 mg via SUBCUTANEOUS
  Filled 2019-12-04: qty 6

## 2019-12-04 NOTE — Telephone Encounter (Cosign Needed Addendum)
Oral Oncology Pharmacy Student Encounter  Received new prescription for Zytiga (abiraterone) for the treatment of metastatic prostate cancer in conjunction with Lupron, planned duration until disease progression or unacceptable drug toxicity.  CBC from 11/28/2019 assessed, Hb and RBC low (11.8, 3.91 respectively) but otherwise unremarkable. OK to proceed with standard Zytiga dosing.  Current medication list in Epic reviewed, no DDIs with Zytiga identified:  Evaluated chart and no patient barriers to medication adherence identified.   Prescription submitted for benefits analysis and approval - Bethena Roys to pursue benefits investigation and manufacturer's assistance if needed.  Oral Oncology Clinic will continue to follow for insurance authorization, copayment issues, initial counseling and start date.  Lowella Fairy D Candidate 2022  [facility] Oral Chemotherapy Navigation Clinic [phone]  12/04/2019 4:23 PM

## 2019-12-05 ENCOUNTER — Ambulatory Visit
Admission: RE | Admit: 2019-12-05 | Discharge: 2019-12-05 | Disposition: A | Discharge status: Home / Self Care | Payer: Medicare Other | Source: Ambulatory Visit | Attending: Oncology | Admitting: Oncology

## 2019-12-05 ENCOUNTER — Other Ambulatory Visit: Payer: Self-pay

## 2019-12-05 ENCOUNTER — Ambulatory Visit
Admission: RE | Admit: 2019-12-05 | Discharge: 2019-12-05 | Disposition: A | Discharge status: Home / Self Care | Payer: Medicare Other | Source: Ambulatory Visit | Attending: Radiation Oncology | Admitting: Radiation Oncology

## 2019-12-05 ENCOUNTER — Telehealth: Payer: Self-pay | Admitting: Pharmacy Technician

## 2019-12-05 DIAGNOSIS — C7951 Secondary malignant neoplasm of bone: Secondary | ICD-10-CM

## 2019-12-05 MED ORDER — LISINOPRIL-HYDROCHLOROTHIAZIDE 20-12.5 MG PO TABS: 1.0000 | ORAL_TABLET | Freq: Every day | ORAL | 0 refills | Status: DC

## 2019-12-05 MED ORDER — ABIRATERONE ACETATE 250 MG PO TABS: 1000.0000 mg | ORAL_TABLET | Freq: Every day | ORAL | 0 refills | Status: DC

## 2019-12-05 MED ORDER — GADOBUTROL 1 MMOL/ML IV SOLN
7.0000 mL | Freq: Once | INTRAVENOUS | Status: AC | PRN
  Administered 2019-12-05: 7 mL via INTRAVENOUS

## 2019-12-05 NOTE — Telephone Encounter (Signed)
Oral Oncology Patient Advocate Encounter  Prior Authorization for Abiraterone has been approved.    PA# [id number] Effective dates: 12/05/19 through 06/02/20  Patients co-pay is $890.25.  Oral Oncology Clinic will continue to follow.   [place] Patient [place] Phone [phone] Fax [phone] 12/05/2019 2:19 PM

## 2019-12-05 NOTE — Telephone Encounter (Signed)
Oral Oncology Patient Advocate Encounter   Received notification from [facility] that prior authorization for Zytiga (Abiraterone) is required.   PA submitted on CoverMyMeds Key [id number] Status is pending   Oral Oncology Clinic will continue to follow.  [place] Patient [place] Phone [phone] Fax [phone] 12/05/2019 2:17 PM

## 2019-12-06 ENCOUNTER — Ambulatory Visit
Admission: RE | Admit: 2019-12-06 | Discharge: 2019-12-06 | Disposition: A | Discharge status: Home / Self Care | Payer: Medicare Other | Source: Ambulatory Visit | Attending: Radiation Oncology | Admitting: Radiation Oncology

## 2019-12-06 ENCOUNTER — Other Ambulatory Visit: Payer: Medicare Other

## 2019-12-06 DIAGNOSIS — C7951 Secondary malignant neoplasm of bone: Secondary | ICD-10-CM | POA: Insufficient documentation

## 2019-12-06 DIAGNOSIS — Z7189 Other specified counseling: Secondary | ICD-10-CM | POA: Insufficient documentation

## 2019-12-06 DIAGNOSIS — C61 Malignant neoplasm of prostate: Secondary | ICD-10-CM | POA: Insufficient documentation

## 2019-12-06 NOTE — Progress Notes (Signed)
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 Posey Pronto for symptoms of left hip pain which prompted x-rays followed by MRI of the left hip without contrast. 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 Patient was subsequently seen by orthopedics Dr. Pathology who did not recommend any orthopedic intervention for the left hip fracture he has been referred to oncology for further management patient's last PSA was checked in 2014 which was elevated at 7.4. Patient is also a chronic smoker and smokes about 1 pack of cigarettes per day since 1968.  Head CT scan showed widespread bony metastatic disease with pathologic fracture of the left acetabulum.  Pathologic fracture of the T3 vertebral body with 50% loss of height possible fracture of the left seventh rib.  No evidence of visceral metastases.  PSA elevated at 35.  Bone biopsy consistent with prostate adenocarcinoma  Interval history-patient is started palliative radiation to his left hip which she will be finishing this week.  He is using as needed oxycodone but was not able to get the OxyContin failed due to significant co-pay.  At this time he is waiting to see how the radiation and treatment for prostate cancer works out to see if he needs to go on OxyContin.  He is also taking as needed Klonopin for anxiety.  ECOG PS- 1 Pain scale-  4 Opioid associated constipation- no  Review of systems- Review of Systems  Constitutional: Positive for malaise/fatigue. Negative for chills, fever and weight loss.  HENT: Negative for congestion, ear discharge and nosebleeds.   Eyes: Negative for blurred vision.  Respiratory: Negative for cough, hemoptysis, sputum production, shortness of breath and wheezing.   Cardiovascular: Negative for chest pain, palpitations, orthopnea and claudication.  Gastrointestinal: Negative for abdominal pain, blood in stool, constipation, diarrhea, heartburn, melena, nausea and vomiting.  Genitourinary: Negative for dysuria, flank pain, frequency, hematuria and urgency.  Musculoskeletal: Negative for back pain, joint pain and myalgias.       Left hip pain  Skin: Negative for rash.  Neurological: Negative for dizziness, tingling, focal weakness, seizures, weakness and headaches.  Endo/Heme/Allergies: Does not bruise/bleed easily.  Psychiatric/Behavioral: Negative for depression and suicidal ideas. The patient does not have insomnia.       Allergies  Allergen Reactions  . Citalopram Other (See Comments)    Unsure but family remembers that he could not take the dru-not sure what happened     Past Medical History:  Diagnosis Date  . Hypertension      No past surgical history on file.  Social History   Socioeconomic History  . Marital status: Married    Spouse name: Not on file  . Number of children: Not on file  . Years of education: Not on file  . Highest education level: Not on file  Occupational History  . Not on file  Tobacco Use  . Smoking status: Current Every Day Smoker    Types: Pipe  . Smokeless tobacco: Never Used  Vaping  Use  . Vaping Use: Never used  Substance and Sexual Activity  . Alcohol use: Yes    Comment: maybe 1 beer a month  . Drug use: Never  . Sexual activity: Not on file  Other Topics Concern  . Not on file  Social History Narrative  . Not on file    Social Determinants of Health   Financial Resource Strain:   . Difficulty of Paying Living Expenses: Not on file  Food Insecurity:   . Worried About Charity fundraiser in the Last Year: Not on file  . Ran Out of Food in the Last Year: Not on file  Transportation Needs:   . Lack of Transportation (Medical): Not on file  . Lack of Transportation (Non-Medical): Not on file  Physical Activity:   . Days of Exercise per Week: Not on file  . Minutes of Exercise per Session: Not on file  Stress:   . Feeling of Stress : Not on file  Social Connections:   . Frequency of Communication with Friends and Family: Not on file  . Frequency of Social Gatherings with Friends and Family: Not on file  . Attends Religious Services: Not on file  . Active Member of Clubs or Organizations: Not on file  . Attends Archivist Meetings: Not on file  . Marital Status: Not on file  Intimate Partner Violence:   . Fear of Current or Ex-Partner: Not on file  . Emotionally Abused: Not on file  . Physically Abused: Not on file  . Sexually Abused: Not on file    No family history on file.   Current Outpatient Medications:  .  Cholecalciferol 125 MCG (5000 UT) capsule, Take 5,000 Units by mouth once a week., Disp: , Rfl:  .  clonazePAM (KLONOPIN) 0.5 MG tablet, Take 1 tablet (0.5 mg total) by mouth 2 (two) times daily as needed., Disp: 60 tablet, Rfl: 0 .  ibuprofen (ADVIL) 200 MG tablet, Take 200 mg by mouth 2 (two) times daily as needed., Disp: , Rfl:  .  oxyCODONE (OXY IR/ROXICODONE) 5 MG immediate release tablet, Take 1 tablet (5 mg total) by mouth every 6 (six) hours as needed for severe pain., Disp: 120 tablet, Rfl: 0 .  oxyCODONE ER (XTAMPZA ER) 9 MG C12A, Take 9 mg by mouth every 12 (twelve) hours., Disp: 60 capsule, Rfl: 0 .  predniSONE (DELTASONE) 10 MG tablet, Take 5 mg by mouth daily., Disp: , Rfl:  .  Zinc 50 MG CAPS, Take 1 capsule by mouth daily., Disp: , Rfl:  .  abiraterone acetate  (ZYTIGA) 250 MG tablet, Take 4 tablets (1,000 mg total) by mouth daily. Take on an empty stomach 1 hour before or 2 hours after a meal, Disp: 120 tablet, Rfl: 0 .  lisinopril-hydrochlorothiazide (ZESTORETIC) 20-12.5 MG tablet, Take 1 tablet by mouth daily., Disp: 30 tablet, Rfl: 0  Physical exam:  Vitals:   12/04/19 0942  BP: (!) 143/55  Pulse: 74  Resp: 16  Temp: (!) 97.2 F (36.2 C)  TempSrc: Tympanic  SpO2: 100%  Weight: 165 lb 8 oz (75.1 kg)   Physical Exam Constitutional:      Comments: Sitting in a wheelchair.  Appears in no acute distress  Cardiovascular:     Rate and Rhythm: Normal rate and regular rhythm.     Heart sounds: Normal heart sounds.  Pulmonary:     Effort: Pulmonary effort is normal.     Breath sounds: Normal breath sounds.  Abdominal:     General: Bowel sounds are normal.     Palpations: Abdomen is soft.  Skin:    General: Skin is warm and dry.  Neurological:     Mental Status: He is alert and oriented to person, place, and time.      CMP Latest Ref Rng & Units 11/08/2019  Glucose 70 - 99 mg/dL 111(H)  BUN 8 - 23 mg/dL 38(H)  Creatinine 0.61 - 1.24 mg/dL 1.41(H)  Sodium 135 - 145 mmol/L 135  Potassium 3.5 - 5.1 mmol/L 5.1  Chloride 98 - 111 mmol/L 101  CO2 22 - 32 mmol/L 26  Calcium 8.9 - 10.3 mg/dL 9.3  Total Protein 6.5 - 8.1 g/dL 8.2(H)  Total Bilirubin 0.3 - 1.2 mg/dL 0.7  Alkaline Phos 38 - 126 U/L 105  AST 15 - 41 U/L 20  ALT 0 - 44 U/L 17   CBC Latest Ref Rng & Units 11/28/2019  WBC 4.0 - 10.5 K/uL 9.0  Hemoglobin 13.0 - 17.0 g/dL 11.8(L)  Hematocrit 39 - 52 % 35.6(L)  Platelets 150 - 400 K/uL 203      MR Thoracic Spine W Wo Contrast  Result Date: 12/05/2019 CLINICAL DATA:  84 year old male with widespread bony metastatic disease, CT-guided bone biopsy earlier this month suggesting metastatic prostate adenocarcinoma on pathology analysis. EXAM: MRI THORACIC WITHOUT AND WITH CONTRAST TECHNIQUE: Multiplanar and multiecho pulse  sequences of the thoracic spine were obtained without and with intravenous contrast. CONTRAST:  55mL GADAVIST GADOBUTROL 1 MMOL/ML IV SOLN COMPARISON:  PET-CT 11/15/2019. FINDINGS: Limited cervical spine imaging: Evidence of abnormal marrow replacement by tumor in the cervical spine C4 through C7 vertebrae. Some superimposed cervical spine degeneration. Thoracic spine segmentation:  Appears to be normal. Alignment: No thoracic spondylolisthesis. Mildly exaggerated upper thoracic kyphosis related to the T3 finding. Vertebrae: Diffuse thoracic bone metastases. All thoracic vertebral bodies are affected. Conspicuous posterior element metastases also at T3, T5, T8, T10, and T11. Pathologic compression fracture of T3 with retropulsed bone and/or tumor. Central T3 vertebral body loss of height of 60%. Associated mild spinal stenosis but no cord compression at this time (series 23, image 9 and series 19, image 9). There is ventral epidural space extension of tumor from the left T11 vertebral body involvement (series 24, image 32). No spinal stenosis at that level (series 23, image 32). No foraminal involvement. No other thoracic epidural tumor extension identified. Bilateral rib metastases. Lumbar spine metastases reported separately today. Cord: Thoracic spinal cord remains normal at this time. Normal conus medullaris at T12-L1. No abnormal intradural enhancement. No dural thickening. Paraspinal and other soft tissues: Negative. Disc levels: No age advanced thoracic spine degeneration. IMPRESSION: 1. Widespread skeletal metastases diffusely involving the thoracic vertebrae. 2. Pathologic compression fracture of T3 with 60% loss of height and retropulsion resulting in mild spinal stenosis. And mild extension of T11 tumor into the left ventral epidural space. But no spinal cord compression at this time. Electronically Signed   By: Genevie Ann M.D.   On: 12/05/2019 23:46   MR Lumbar Spine W Wo Contrast  Result Date:  12/05/2019 CLINICAL DATA:  84 year old male with widespread bony metastatic disease, CT-guided bone biopsy earlier this month suggesting metastatic prostate adenocarcinoma on pathology analysis. EXAM: MRI LUMBAR SPINE WITHOUT AND WITH CONTRAST TECHNIQUE: Multiplanar and multiecho pulse sequences of the lumbar spine were obtained without and with intravenous contrast. CONTRAST:  66mL GADAVIST GADOBUTROL 1 MMOL/ML IV SOLN COMPARISON:  Thoracic spine MRI today reported separately. PET-CT 11/15/2019.  FINDINGS: Segmentation: Normal, concordant with the thoracic spine numbering today. Alignment:  Preserved lumbar lordosis.  No spondylolisthesis. Vertebrae: Diffuse bone metastases throughout the visible lumbosacral spine. Vertebral body metastases at all levels. Posterior element metastases also at the L2, S1 levels. No pathologic fracture identified. Confluent metastases also in the left sacral ala and right iliac wing. No lumbar epidural tumor identified. No upper sacral epidural tumor identified. Conus medullaris and cauda equina: Conus extends to the L1 level. No lower spinal cord or conus signal abnormality. No abnormal intradural enhancement. No dural thickening. Paraspinal and other soft tissues: Benign left renal parapelvic cysts. Mildly distended urinary bladder. Disc levels: No age advanced lumbar spine degeneration. Mild degenerative spinal stenosis at L3-L4. IMPRESSION: 1. Widespread skeletal metastases diffusely involving the lumbar spine and visible sacrum. 2. No lumbar pathologic fracture or extraosseous tumor. 3. Mild for age lumbar spine degeneration. Electronically Signed   By: Genevie Ann M.D.   On: 12/05/2019 23:51   NM PET Image Initial (PI) Skull Base To Thigh  Result Date: 11/15/2019 CLINICAL DATA:  Initial treatment strategy for findings of bone metastases on recent MRI from unknown primary, subsequently found to have elevated PSA. EXAM: NUCLEAR MEDICINE PET SKULL BASE TO THIGH TECHNIQUE: 8.2 mCi  F-18 FDG was injected intravenously. Full-ring PET imaging was performed from the skull base to thigh after the radiotracer. CT data was obtained and used for attenuation correction and anatomic localization. Fasting blood glucose: 95 mg/dl COMPARISON:  MRI of the hip of October 29, 2019 FINDINGS: Mediastinal blood pool activity: SUV max 3.65 Liver activity: SUV max NA NECK: No hypermetabolic lymph nodes in the neck. Incidental CT findings: none CHEST: Single small lymph node, high RIGHT paratracheal unchanged with respect to size dating back to 2009 (SUVmax = 3.2) (image 49, series 4) no adenopathy by size criteria or other lymph nodes with increased metabolic activity. Incidental CT findings: No suspicious pulmonary nodule. No consolidation. No pleural effusion. Airways are patent. Calcified atheromatous plaque of the thoracic aorta. Normal heart size. Calcified coronary artery disease. No pericardial effusion. ABDOMEN/PELVIS: No abnormal hypermetabolic activity within the liver, pancreas, adrenal glands, or spleen. No hypermetabolic lymph nodes in the abdomen or pelvis. Heterogeneous uptake in the prostate volume is a Lea nonspecific is seen in the setting of elevated PSA. Maximum SUV of 4.5 Incidental CT findings: No solid visceral abnormality. The adrenal glands are normal. No acute gastrointestinal process. Calcified plaque in the abdominal aorta without aneurysmal dilation. No adenopathy by size criteria in the pelvis or in the abdomen. SKELETON: Signs of ORIF of the LEFT proximal femur. LEFT proximal femoral metastasis (image 190, series 4) added density in the medullary space of the LEFT proximal femur with maximum SUV of 6.4. Subtle sclerosis in the RIGHT hemi sacrum associated with increased metabolic activity (image 659, series 4) (SUVmax = 6.4 this is the most pronounced area of sacral uptake but with diffuse uptake as described below. Bilateral iliac and diffuse sacral uptake compatible with known  lesions in these locations. Destructive lesion of the LEFT acetabulum (image 165, series 4) pathologic fracture associated with this area as seen on recent MRI (SUVmax = 6.5) Increased metabolic activity in the sternum. Sternal lesion also with subtle sclerosis on image 67 of series 4 but with more widespread hypermetabolic changes in the sternum. Hypermetabolic focus along the LEFT lateral seventh rib with pathologic fracture. Also with subtle increased metabolic activity in the LEFT posterior seventh rib and in the LEFT eighth rib. Subtle increased  metabolic activity associated with the RIGHT lateral third rib. Multilevel increased metabolic activity in the spine, involving the thoracic and lumbar spine. Bilateral ischial involvement. RIGHT femoral involvement. Loss of height at T3 and T4 with marked hypermetabolic activity at the T3 level (SUVmax = 8.2. T3 with approximately 50% loss of height likely related to pathologic fracture. Every level of the thoracic and lumbar spine shows heterogeneous involvement. Another index lesion in the spine at L1 shows metabolic activity with maximum SUV of 6.8 Uptake about the LEFT shoulder could be related to degenerative changes. Incidental CT findings: Spinal degenerative changes. IMPRESSION: 1. Widespread bony metastatic disease presumably related to prostate cancer given the patient's PSA. 2. Pathologic fractures in the LEFT acetabulum and also suspected at T3 and in LEFT seventh rib as described. T3 fracture may have as much as 50% loss of height. 3. Presumed degenerative changes about the LEFT shoulder but with other areas of diffuse bony metastatic disease with suggest close attention to this area on subsequent imaging 4. Small mildly hypermetabolic lymph node in the superior mediastinum along the RIGHT paratracheal chain is actually smaller than on the study of 2009 and may be reactive in this patient with patulous esophagus, potentially related to esophagitis.  Attention on follow-up and with dedicated esophageal assessment based on symptoms. These results will be called to the ordering clinician or representative by the Radiologist Assistant, and communication documented in the PACS or [employer]. Electronically Signed   By: Zetta Bills M.D.   On: 11/15/2019 16:11   CT Biopsy  Result Date: 11/28/2019 INDICATION: No known primary, now with hypermetabolic osseous metastasis. Please perform CT-guided biopsy for tissue diagnostic purposes. EXAM: CT-GUIDED BIOPSY OF HYPERMETABOLIC LESION INVOLVING THE RIGHT ILIUM MEDICATIONS: None COMPARISON:  PET-CT-11/15/2019; left hip MRI-10/30/2019 ANESTHESIA/SEDATION: Fentanyl 50 mcg IV; Versed 1 mg IV Sedation time: 18 minutes; The patient was continuously monitored during the procedure by the interventional radiology nurse under my direct supervision. COMPLICATIONS: None immediate. PROCEDURE: Informed consent was obtained from the patient following an explanation of the procedure, risks, benefits and alternatives. The patient understands, agrees and consents for the procedure. All questions were addressed. A time out was performed prior to the initiation of the procedure. The patient was positioned prone on the CT table and a limited CT was performed for procedural planning demonstrating unchanged size and appearance of the mixed lytic and sclerotic lesion involving the right ilium (image 25, series 2). The procedure was planned. The operative site was prepped and draped in the usual sterile fashion. Appropriate trajectory was confirmed with a 22 gauge spinal needle after the adjacent tissues were anesthetized with 1% Lidocaine with epinephrine. Next, an 11 gauge coaxial bone biopsy needle was advanced into the right iliac marrow space adjacent to the peripheral aspect of the mixed lytic and sclerotic lesion. Needle position was confirmed with CT imaging. Initially, 2 bone biopsies were obtained with the inner 9 gauge bone  biopsy device with appropriate positioning confirmed with intermittent CT imaging. Next, the 11 gauge bone biopsy needle was utilized to obtain an additional core sample. Again, under intermittent CT guidance, the 11 gauge bone biopsy needle was utilized to obtain 2 additional core biopsy samples. The needle was removed and superficial hemostasis was obtained with manual compression. A dressing was applied. The patient tolerated the procedure well without immediate post procedural complication. IMPRESSION: Successful CT guided biopsy of ill-defined mixed lytic and sclerotic lesion involving the right ilium. Electronically Signed   By: Sandi Mariscal  M.D.   On: 11/28/2019 12:57     Assessment and plan- Patient is a 84 y.o. male with new diagnosis of castrate sensitive metastatic prostate cancer with bone metastases  Discussed the results of bone biopsy which was consistent with prostate adenocarcinoma.  Since we did not do a prostate biopsy we do not have an idea of the Gleason score.However patient has evidence of extensive bony metastatic disease with at least one lesion outside the vertebral body involving the left acetabulum as well as multiple ribs and extensive area of his pelvis.  He would therefore qualify for additional androgen deprivation therapy in addition to ADT.  Patient will be receiving his first dose of Firmagon today.  I will see him back in 1 month for the second dose of Firmagon and following that I will switch him to Lupron every 3 months.  Given his age I prefer doing androgen deprivation therapy with Zytiga over upfront docetaxel.  He will need to take Zytiga 1000 mg daily along with prednisone but patient is already on chronic prednisone.  Discussed risks and benefits of Zytiga including all but not limited to fatigue,Anemia, hot flashes, hypertension, leg swelling, electrolyte imbalances and abnormal LFTs.  Treatment will be given with a palliative intent.  I will plan to start Zytiga  in 1 month's time and we will work with insurance to get it approved.  Neoplasm related left hip pain: He has completed palliative radiation and hopefully [facility] Koller should help as well.  He will continue as needed oxycodone at this time.  He has not taken Xtampza ER due to significant co-pay.  No role for bisphosphonates and castrate sensitive disease and will save it for castrate resistant disease in the future.  Patient and his wife had multiple insightful questions and all of them were answered to their satisfaction.   Visit Diagnosis 1. Bone metastases ([place])   2. Goals of care, counseling/discussion   3. Prostate cancer metastatic to bone [facility])      Dr. Randa Evens, MD, MPH [facility] [phone] 12/06/2019 4:29 PM

## 2019-12-06 NOTE — Progress Notes (Addendum)
Tumor Board Documentation  Alexis Cruz was presented by Dr Janese Banks at our Tumor Board on 12/06/2019, which included representatives from medical oncology, radiation oncology, surgical oncology, surgical, radiology, pathology, navigation, internal medicine, research, genetics, pharmacy, pulmonology.  Alexis Cruz currently presents as a current patient, for new positive pathology, for [place] with history of the following treatments: active survellience, surgical intervention(s).  Additionally, we reviewed previous medical and familial history, history of present illness, and recent lab results along with all available histopathologic and imaging studies. The tumor board considered available treatment options and made the following recommendations: Palliative radiation Refer to Genetics, ADT therapy and abiraterone  The following procedures/referrals were also placed: No orders of the defined types were placed in this encounter.   Clinical Trial Status: not discussed   Staging used: AJCC Stage Group  AJCC Staging:       Group: Stage IV prostae Cancer with Bone Mets   National site-specific guidelines NCCN were discussed with respect to the case.  Tumor board is a meeting of clinicians from various specialty areas who evaluate and discuss patients for whom a multidisciplinary approach is being considered. Final determinations in the plan of care are those of the provider(s). The responsibility for follow up of recommendations given during tumor board is that of the provider.   Today's extended care, comprehensive team conference, Alexis Cruz was not present for the discussion and was not examined.   Multidisciplinary Tumor Board is a multidisciplinary case peer review process.  Decisions discussed in the Multidisciplinary Tumor Board reflect the opinions of the specialists present at the conference without having examined the patient.  Ultimately, treatment and diagnostic decisions rest with the primary  provider(s) and the patient.

## 2019-12-06 NOTE — Progress Notes (Signed)
START ON PATHWAY REGIMEN - Prostate     A cycle is every 28 days:     Abiraterone acetate (conventional)      Prednisone   **Always confirm dose/schedule in your pharmacy ordering system**  Patient Characteristics: Adenocarcinoma, Recurrent/New Systemic Disease, Non-Castrate, M1, High Volume Disease Histology: Adenocarcinoma Therapeutic Status: Recurrent/New Systemic Disease Intent of Therapy: Non-Curative / Palliative Intent, Discussed with Patient 

## 2019-12-07 ENCOUNTER — Ambulatory Visit
Admission: RE | Admit: 2019-12-07 | Discharge: 2019-12-07 | Disposition: A | Discharge status: Home / Self Care | Payer: Medicare Other | Source: Ambulatory Visit | Attending: Radiation Oncology | Admitting: Radiation Oncology

## 2019-12-07 DIAGNOSIS — C7951 Secondary malignant neoplasm of bone: Secondary | ICD-10-CM | POA: Diagnosis not present

## 2019-12-10 ENCOUNTER — Ambulatory Visit
Admission: RE | Admit: 2019-12-10 | Discharge: 2019-12-10 | Disposition: A | Discharge status: Home / Self Care | Payer: Medicare Other | Source: Ambulatory Visit | Attending: Radiation Oncology | Admitting: Radiation Oncology

## 2019-12-10 DIAGNOSIS — C7951 Secondary malignant neoplasm of bone: Secondary | ICD-10-CM | POA: Diagnosis not present

## 2019-12-12 ENCOUNTER — Telehealth: Payer: Self-pay | Admitting: Pharmacy Technician

## 2019-12-12 NOTE — Telephone Encounter (Signed)
Oral Oncology Patient Advocate Encounter  Met patient and wife after radiation appt on 11/28/01 to complete application for [employer] Patient Assistance in an effort to reduce patient's out of pocket expense for Zytiga to $0.    Application completed and faxed to [phone] on 12/11/19.   [employer] patient assistance phone number for follow up is [phone].   This encounter will be updated until final determination.   [place] Patient [place] Phone [phone] Fax [phone] 12/12/2019 11:05 AM

## 2019-12-14 ENCOUNTER — Telehealth: Payer: Self-pay | Admitting: Pharmacy Technician

## 2019-12-14 ENCOUNTER — Other Ambulatory Visit: Payer: Self-pay | Admitting: Oncology

## 2019-12-14 MED ORDER — ABIRATERONE ACETATE 250 MG PO TABS: 1000.0000 mg | ORAL_TABLET | Freq: Every day | ORAL | 0 refills | Status: DC

## 2019-12-14 NOTE — Telephone Encounter (Signed)
Oral Chemotherapy Pharmacist Encounter   Patient was approved for a TAF grant to cover the cost of his medication. Prescription sent to [place].  Darl Pikes, PharmD, BCPS, BCOP, CPP Hematology/Oncology Clinical Pharmacist [facility]/HP/AP [facility] [phone]  12/14/2019 1:57 PM

## 2019-12-14 NOTE — Telephone Encounter (Signed)
Oral Oncology Patient Advocate Encounter  Patient has been approved for copay assistance with [employer] (TAF).  [employer] [place] will cover all copayment expenses for Zytiga (Abiraterone) for the remainder of the calendar year.    The billing information is as follows and has been shared with [place].   Member ID: [id number] Group ID: [id number] PCN: [id number] BIN: [id number] Eligibility Dates: 12/14/19 to 03/21/20  Fund: [place] [facility] [place] Phone [phone] Fax [phone] 12/14/2019 1:33 PM

## 2019-12-17 NOTE — Telephone Encounter (Signed)
Application discontinued due to grant funding opening through [employer].

## 2019-12-24 ENCOUNTER — Other Ambulatory Visit: Payer: Self-pay

## 2019-12-24 ENCOUNTER — Telehealth: Payer: Self-pay | Admitting: Pharmacy Technician

## 2019-12-24 ENCOUNTER — Ambulatory Visit
Admission: RE | Admit: 2019-12-24 | Discharge: 2019-12-24 | Disposition: A | Discharge status: Home / Self Care | Payer: Medicare Other | Source: Ambulatory Visit | Attending: Radiation Oncology | Admitting: Radiation Oncology

## 2019-12-24 VITALS — Temp 96.9°F | Wt 169.0 lb

## 2019-12-24 DIAGNOSIS — C7951 Secondary malignant neoplasm of bone: Secondary | ICD-10-CM | POA: Diagnosis not present

## 2019-12-24 DIAGNOSIS — C61 Malignant neoplasm of prostate: Secondary | ICD-10-CM | POA: Insufficient documentation

## 2019-12-24 NOTE — Progress Notes (Signed)
Radiation Oncology Follow up Note  Name: Alexis Cruz   Date:   12/24/2019 MRN:  [id number] DOB: 1935-05-07    This 84 y.o. male presents to the clinic today for reevaluation of spinal metastasis and patient with known stage IV prostate cancer previously treated to his left hip.  REFERRING PROVIDER: No ref. provider found  HPI: Patient is a 84 year old male.  With biopsy-proven stage IV prostate cancer recently treated to his left hip for pathologic fracture with excellent result as far as pain is concerned.  He has 2 lesions in his T3 and T11 region with some mild posterior protrusion on the spinal canal.  Patient has been started on Firmagon and is planning to switch Lupron every 3 months in the future.  He is seen today for consideration of palliative treatment to those areas.  He states he is ambulating well he states he does have some lower back pain rating down into his left lower extremity which may very well be from his T11 spinal lesion.  COMPLICATIONS OF TREATMENT: none  FOLLOW UP COMPLIANCE: keeps appointments   PHYSICAL EXAM:  Temp (!) 96.9 F (36.1 C) (Tympanic)   Wt 169 lb (76.7 kg)   BMI 28.12 kg/m  Motor or sensory and DTR levels are equal and symmetric in the lower extremities.  Well-developed well-nourished patient in NAD. HEENT reveals PERLA, EOMI, discs not visualized.  Oral cavity is clear. No oral mucosal lesions are identified. Neck is clear without evidence of cervical or supraclavicular adenopathy. Lungs are clear to A&P. Cardiac examination is essentially unremarkable with regular rate and rhythm without murmur rub or thrill. Abdomen is benign with no organomegaly or masses noted. Motor sensory and DTR levels are equal and symmetric in the upper and lower extremities. Cranial nerves II through XII are grossly intact. Proprioception is intact. No peripheral adenopathy or edema is identified. No motor or sensory levels are noted. Crude visual fields are within normal  range.  RADIOLOGY RESULTS: MRI scans are reviewed compatible with above-stated findings  PLAN: This time is go ahead with palliative radiation therapy to both T11 as well as T3 vertebral bodies.  Plan on delivering 3000 cGy in 10 fractions.  Risks and benefits of treatment including slight possibility of dysphagia secondary to radiation esophagitis skin reaction alteration of blood counts and fatigue all were discussed with the patient.  I personally set up and ordered CT simulation for later this week.  I would like to take this opportunity to thank you for allowing me to participate in the care of your patient.Noreene Filbert, MD

## 2019-12-24 NOTE — Telephone Encounter (Signed)
Oral Oncology Patient Advocate Encounter  Was successful in securing patient a $8,000 grant from [employer] to provide copayment coverage for Zytiga.  This will keep the out of pocket expense at $0.     [employer] ID: [id number]  I have spoken with the patient.   The billing information is as follows and has been shared with [place].    RxBin: [id number] PCN: [id number] Member ID: [id number] Group ID: [id number] Dates of Eligibility: 11/24/19 through 11/22/20  Fund:  [place] Patient [place] Phone [phone] Fax [phone] 12/24/2019 11:22 AM

## 2019-12-26 ENCOUNTER — Ambulatory Visit
Admission: RE | Admit: 2019-12-26 | Discharge: 2019-12-26 | Disposition: A | Discharge status: Home / Self Care | Payer: Medicare Other | Source: Ambulatory Visit | Attending: Radiation Oncology | Admitting: Radiation Oncology

## 2019-12-26 DIAGNOSIS — C61 Malignant neoplasm of prostate: Secondary | ICD-10-CM | POA: Diagnosis present

## 2019-12-26 DIAGNOSIS — C7951 Secondary malignant neoplasm of bone: Secondary | ICD-10-CM | POA: Diagnosis present

## 2019-12-26 MED FILL — ABIRATERONE ACETATE 250 MG: 250 | 30 days supply | Qty: 120 | Fill #0

## 2019-12-26 NOTE — Telephone Encounter (Signed)
Oral Oncology Patient Advocate Encounter  I spoke with Corky, pt's wife, this afternoon to set up delivery of Zytiga.  Address verified for shipment.  Fabio Asa will be filled through [facility] and mailed 12/26/19 for delivery 12/27/19.    [place] will call 7-10 days before next refill is due to complete adherence call and set up delivery of medication.     [place] Patient [place] Phone [phone] Fax [phone] 12/26/2019 3:44 PM

## 2019-12-27 DIAGNOSIS — C7951 Secondary malignant neoplasm of bone: Secondary | ICD-10-CM | POA: Diagnosis not present

## 2019-12-31 ENCOUNTER — Ambulatory Visit: Admission: RE | Admit: 2019-12-31 | Payer: Medicare Other | Source: Ambulatory Visit

## 2019-12-31 DIAGNOSIS — C7951 Secondary malignant neoplasm of bone: Secondary | ICD-10-CM | POA: Diagnosis not present

## 2020-01-01 ENCOUNTER — Ambulatory Visit
Admission: RE | Admit: 2020-01-01 | Discharge: 2020-01-01 | Disposition: A | Discharge status: Home / Self Care | Payer: Medicare Other | Source: Ambulatory Visit | Attending: Radiation Oncology | Admitting: Radiation Oncology

## 2020-01-01 DIAGNOSIS — C7951 Secondary malignant neoplasm of bone: Secondary | ICD-10-CM | POA: Diagnosis not present

## 2020-01-02 ENCOUNTER — Ambulatory Visit
Admission: RE | Admit: 2020-01-02 | Discharge: 2020-01-02 | Disposition: A | Discharge status: Home / Self Care | Payer: Medicare Other | Source: Ambulatory Visit | Attending: Radiation Oncology | Admitting: Radiation Oncology

## 2020-01-02 DIAGNOSIS — C7951 Secondary malignant neoplasm of bone: Secondary | ICD-10-CM | POA: Diagnosis not present

## 2020-01-03 ENCOUNTER — Other Ambulatory Visit: Payer: Self-pay

## 2020-01-03 ENCOUNTER — Encounter: Payer: Self-pay | Admitting: Oncology

## 2020-01-03 ENCOUNTER — Inpatient Hospital Stay: Payer: Medicare Other | Admitting: Pharmacist

## 2020-01-03 ENCOUNTER — Ambulatory Visit
Admission: RE | Admit: 2020-01-03 | Discharge: 2020-01-03 | Disposition: A | Discharge status: Home / Self Care | Payer: Medicare Other | Source: Ambulatory Visit | Attending: Radiation Oncology | Admitting: Radiation Oncology

## 2020-01-03 ENCOUNTER — Inpatient Hospital Stay (HOSPITAL_BASED_OUTPATIENT_CLINIC_OR_DEPARTMENT_OTHER): Payer: Medicare Other | Admitting: Oncology

## 2020-01-03 ENCOUNTER — Inpatient Hospital Stay: Payer: Medicare Other | Attending: Oncology

## 2020-01-03 ENCOUNTER — Inpatient Hospital Stay: Payer: Medicare Other

## 2020-01-03 VITALS — BP 151/72 | HR 76 | Temp 96.7°F | Resp 16 | Wt 166.3 lb

## 2020-01-03 DIAGNOSIS — C7951 Secondary malignant neoplasm of bone: Secondary | ICD-10-CM | POA: Insufficient documentation

## 2020-01-03 DIAGNOSIS — G893 Neoplasm related pain (acute) (chronic): Secondary | ICD-10-CM

## 2020-01-03 DIAGNOSIS — C61 Malignant neoplasm of prostate: Secondary | ICD-10-CM

## 2020-01-03 DIAGNOSIS — Z79818 Long term (current) use of other agents affecting estrogen receptors and estrogen levels: Secondary | ICD-10-CM

## 2020-01-03 DIAGNOSIS — Z79899 Other long term (current) drug therapy: Secondary | ICD-10-CM | POA: Diagnosis not present

## 2020-01-03 DIAGNOSIS — Z7952 Long term (current) use of systemic steroids: Secondary | ICD-10-CM | POA: Diagnosis not present

## 2020-01-03 DIAGNOSIS — Z191 Hormone sensitive malignancy status: Secondary | ICD-10-CM

## 2020-01-03 DIAGNOSIS — I10 Essential (primary) hypertension: Secondary | ICD-10-CM | POA: Diagnosis not present

## 2020-01-03 DIAGNOSIS — F17210 Nicotine dependence, cigarettes, uncomplicated: Secondary | ICD-10-CM | POA: Insufficient documentation

## 2020-01-03 LAB — CBC WITH DIFFERENTIAL/PLATELET
Abs Immature Granulocytes: 0.05 10*3/uL (ref 0.00–0.07)
Basophils Absolute: 0 10*3/uL (ref 0.0–0.1)
Basophils Relative: 0 %
Eosinophils Absolute: 0 10*3/uL (ref 0.0–0.5)
Eosinophils Relative: 0 %
HCT: 29.3 % — ABNORMAL LOW (ref 39.0–52.0)
Hemoglobin: 9.7 g/dL — ABNORMAL LOW (ref 13.0–17.0)
Immature Granulocytes: 1 %
Lymphocytes Relative: 16 %
Lymphs Abs: 0.8 10*3/uL (ref 0.7–4.0)
MCH: 30.6 pg (ref 26.0–34.0)
MCHC: 33.1 g/dL (ref 30.0–36.0)
MCV: 92.4 fL (ref 80.0–100.0)
Monocytes Absolute: 0.9 10*3/uL (ref 0.1–1.0)
Monocytes Relative: 18 %
Neutro Abs: 3.1 10*3/uL (ref 1.7–7.7)
Neutrophils Relative %: 65 %
Platelets: 153 10*3/uL (ref 150–400)
RBC: 3.17 MIL/uL — ABNORMAL LOW (ref 4.22–5.81)
RDW: 15.5 % (ref 11.5–15.5)
WBC: 4.9 10*3/uL (ref 4.0–10.5)
nRBC: 0 % (ref 0.0–0.2)

## 2020-01-03 LAB — COMPREHENSIVE METABOLIC PANEL
ALT: 17 U/L (ref 0–44)
AST: 22 U/L (ref 15–41)
Albumin: 4.2 g/dL (ref 3.5–5.0)
Alkaline Phosphatase: 119 U/L (ref 38–126)
Anion gap: 8 (ref 5–15)
BUN: 29 mg/dL — ABNORMAL HIGH (ref 8–23)
CO2: 25 mmol/L (ref 22–32)
Calcium: 9.4 mg/dL (ref 8.9–10.3)
Chloride: 103 mmol/L (ref 98–111)
Creatinine, Ser: 1.17 mg/dL (ref 0.61–1.24)
GFR, Estimated: 58 mL/min — ABNORMAL LOW (ref >60)
Glucose, Bld: 108 mg/dL — ABNORMAL HIGH (ref 70–99)
Potassium: 4.6 mmol/L (ref 3.5–5.1)
Sodium: 136 mmol/L (ref 135–145)
Total Bilirubin: 0.9 mg/dL (ref 0.3–1.2)
Total Protein: 7.5 g/dL (ref 6.5–8.1)

## 2020-01-03 LAB — PSA: Prostatic Specific Antigen: 2.7 ng/mL (ref 0.00–4.00)

## 2020-01-03 MED ORDER — DEGARELIX ACETATE 80 MG ~~LOC~~ SOLR
80.0000 mg | Freq: Once | SUBCUTANEOUS | Status: AC
  Administered 2020-01-03: 80 mg via SUBCUTANEOUS
  Filled 2020-01-03: qty 4

## 2020-01-03 MED ORDER — PREDNISONE 5 MG PO TABS: 5.0000 mg | ORAL_TABLET | Freq: Every day | ORAL | 3 refills | Status: DC

## 2020-01-03 NOTE — Progress Notes (Signed)
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 Posey Pronto for symptoms of left hip pain which prompted x-rays followed by MRI of the left hip without contrast. 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 Patient was subsequently seen by orthopedics Dr. Pathology who did not recommend any orthopedic intervention for the left hip fracture he has been referred to oncology for further management patient's last PSA was checked in 2014 which was elevated at 7.4. Patient is also a chronic smoker and smokes about 1 pack of cigarettes per day since 1968.  Head CT scan showed widespread bony metastatic disease with pathologic fracture of the left acetabulum. Pathologic fracture of the T3 vertebral body with 50% loss of height possible fracture of the left seventh rib.No evidence of visceral metastases. PSA elevated at 35.  Bone biopsy consistent with prostate adenocarcinoma  Interval history-reports that his back pain has improved significantly as well as his hip pain.  He is using oxycodone sparingly.  He has only required about 2 Tylenols every day which is controlling his pain well.  He completes palliative radiation to his back next week.  ECOG PS- 1 Pain scale- 0 Opioid associated constipation- no  Review of systems- Review of Systems  Constitutional: Negative for  chills, fever, malaise/fatigue and weight loss.  HENT: Negative for congestion, ear discharge and nosebleeds.   Eyes: Negative for blurred vision.  Respiratory: Negative for cough, hemoptysis, sputum production, shortness of breath and wheezing.   Cardiovascular: Negative for chest pain, palpitations, orthopnea and claudication.  Gastrointestinal: Negative for abdominal pain, blood in stool, constipation, diarrhea, heartburn, melena, nausea and vomiting.  Genitourinary: Negative for dysuria, flank pain, frequency, hematuria and urgency.  Musculoskeletal: Positive for back pain. Negative for joint pain and myalgias.  Skin: Negative for rash.  Neurological: Negative for dizziness, tingling, focal weakness, seizures, weakness and headaches.  Endo/Heme/Allergies: Does not bruise/bleed easily.  Psychiatric/Behavioral: Negative for depression and suicidal ideas. The patient does not have insomnia.       Allergies  Allergen Reactions  . Citalopram Other (See Comments)    Unsure but family remembers that he could not take the dru-not sure what happened     Past Medical History:  Diagnosis Date  . Hypertension      No past surgical history on file.  Social History   Socioeconomic History  . Marital status: Married    Spouse name: Not on file  . Number of children: Not on file  . Years of education: Not on file  . Highest education level: Not on file  Occupational History  . Not on file  Tobacco Use  . Smoking status: Current Every Day Smoker    Types: Pipe  . Smokeless tobacco: Never Used  Vaping Use  . Vaping Use: Never used  Substance and Sexual Activity  . Alcohol use: Yes    Comment: maybe 1 beer a month  . Drug use: Never  . Sexual activity: Not on  file  Other Topics Concern  . Not on file  Social History Narrative  . Not on file   Social Determinants of Health   Financial Resource Strain:   . Difficulty of Paying Living Expenses: Not on file  Food Insecurity:    . Worried About Charity fundraiser in the Last Year: Not on file  . Ran Out of Food in the Last Year: Not on file  Transportation Needs:   . Lack of Transportation (Medical): Not on file  . Lack of Transportation (Non-Medical): Not on file  Physical Activity:   . Days of Exercise per Week: Not on file  . Minutes of Exercise per Session: Not on file  Stress:   . Feeling of Stress : Not on file  Social Connections:   . Frequency of Communication with Friends and Family: Not on file  . Frequency of Social Gatherings with Friends and Family: Not on file  . Attends Religious Services: Not on file  . Active Member of Clubs or Organizations: Not on file  . Attends Archivist Meetings: Not on file  . Marital Status: Not on file  Intimate Partner Violence:   . Fear of Current or Ex-Partner: Not on file  . Emotionally Abused: Not on file  . Physically Abused: Not on file  . Sexually Abused: Not on file    No family history on file.   Current Outpatient Medications:  .  Cholecalciferol 125 MCG (5000 UT) capsule, Take 5,000 Units by mouth once a week., Disp: , Rfl:  .  clonazePAM (KLONOPIN) 0.5 MG tablet, Take 1 tablet (0.5 mg total) by mouth 2 (two) times daily as needed., Disp: 60 tablet, Rfl: 0 .  ibuprofen (ADVIL) 200 MG tablet, Take 200 mg by mouth 2 (two) times daily as needed., Disp: , Rfl:  .  lisinopril-hydrochlorothiazide (ZESTORETIC) 20-12.5 MG tablet, Take 1 tablet by mouth daily., Disp: 30 tablet, Rfl: 0 .  oxyCODONE (OXY IR/ROXICODONE) 5 MG immediate release tablet, Take 1 tablet (5 mg total) by mouth every 6 (six) hours as needed for severe pain., Disp: 120 tablet, Rfl: 0 .  abiraterone acetate (ZYTIGA) 250 MG tablet, Take 4 tablets (1,000 mg total) by mouth daily. Take on an empty stomach 1 hour before or 2 hours after a meal (Patient not taking: Reported on 01/03/2020), Disp: 120 tablet, Rfl: 0 .  predniSONE (DELTASONE) 5 MG tablet, Take 1 tablet (5 mg total) by  mouth daily with breakfast., Disp: 30 tablet, Rfl: 3 .  Zinc 50 MG CAPS, Take 1 capsule by mouth daily. (Patient not taking: Reported on 01/03/2020), Disp: , Rfl:   Physical exam:  Vitals:   01/03/20 0916  BP: (!) 151/72  Pulse: 76  Resp: 16  Temp: (!) 96.7 F (35.9 C)  TempSrc: Tympanic  SpO2: 100%  Weight: 166 lb 4.8 oz (75.4 kg)   Physical Exam Constitutional:      General: He is not in acute distress. Cardiovascular:     Heart sounds: Normal heart sounds.  Pulmonary:     Effort: Pulmonary effort is normal.  Skin:    General: Skin is warm and dry.  Neurological:     Mental Status: He is alert and oriented to person, place, and time.      CMP Latest Ref Rng & Units 01/03/2020  Glucose 70 - 99 mg/dL 108(H)  BUN 8 - 23 mg/dL 29(H)  Creatinine 0.61 - 1.24 mg/dL 1.17  Sodium 135 - 145 mmol/L 136  Potassium 3.5 - 5.1 mmol/L 4.6  Chloride 98 - 111 mmol/L 103  CO2 22 - 32 mmol/L 25  Calcium 8.9 - 10.3 mg/dL 9.4  Total Protein 6.5 - 8.1 g/dL 7.5  Total Bilirubin 0.3 - 1.2 mg/dL 0.9  Alkaline Phos 38 - 126 U/L 119  AST 15 - 41 U/L 22  ALT 0 - 44 U/L 17   CBC Latest Ref Rng & Units 01/03/2020  WBC 4.0 - 10.5 K/uL 4.9  Hemoglobin 13.0 - 17.0 g/dL 9.7(L)  Hematocrit 39 - 52 % 29.3(L)  Platelets 150 - 400 K/uL 153    No images are attached to the encounter.  MR Thoracic Spine W Wo Contrast  Result Date: 12/05/2019 CLINICAL DATA:  84 year old male with widespread bony metastatic disease, CT-guided bone biopsy earlier this month suggesting metastatic prostate adenocarcinoma on pathology analysis. EXAM: MRI THORACIC WITHOUT AND WITH CONTRAST TECHNIQUE: Multiplanar and multiecho pulse sequences of the thoracic spine were obtained without and with intravenous contrast. CONTRAST:  86mL GADAVIST GADOBUTROL 1 MMOL/ML IV SOLN COMPARISON:  PET-CT 11/15/2019. FINDINGS: Limited cervical spine imaging: Evidence of abnormal marrow replacement by tumor in the cervical spine C4 through C7  vertebrae. Some superimposed cervical spine degeneration. Thoracic spine segmentation:  Appears to be normal. Alignment: No thoracic spondylolisthesis. Mildly exaggerated upper thoracic kyphosis related to the T3 finding. Vertebrae: Diffuse thoracic bone metastases. All thoracic vertebral bodies are affected. Conspicuous posterior element metastases also at T3, T5, T8, T10, and T11. Pathologic compression fracture of T3 with retropulsed bone and/or tumor. Central T3 vertebral body loss of height of 60%. Associated mild spinal stenosis but no cord compression at this time (series 23, image 9 and series 19, image 9). There is ventral epidural space extension of tumor from the left T11 vertebral body involvement (series 24, image 32). No spinal stenosis at that level (series 23, image 32). No foraminal involvement. No other thoracic epidural tumor extension identified. Bilateral rib metastases. Lumbar spine metastases reported separately today. Cord: Thoracic spinal cord remains normal at this time. Normal conus medullaris at T12-L1. No abnormal intradural enhancement. No dural thickening. Paraspinal and other soft tissues: Negative. Disc levels: No age advanced thoracic spine degeneration. IMPRESSION: 1. Widespread skeletal metastases diffusely involving the thoracic vertebrae. 2. Pathologic compression fracture of T3 with 60% loss of height and retropulsion resulting in mild spinal stenosis. And mild extension of T11 tumor into the left ventral epidural space. But no spinal cord compression at this time. Electronically Signed   By: Genevie Ann M.D.   On: 12/05/2019 23:46   MR Lumbar Spine W Wo Contrast  Result Date: 12/05/2019 CLINICAL DATA:  84 year old male with widespread bony metastatic disease, CT-guided bone biopsy earlier this month suggesting metastatic prostate adenocarcinoma on pathology analysis. EXAM: MRI LUMBAR SPINE WITHOUT AND WITH CONTRAST TECHNIQUE: Multiplanar and multiecho pulse sequences of the  lumbar spine were obtained without and with intravenous contrast. CONTRAST:  41mL GADAVIST GADOBUTROL 1 MMOL/ML IV SOLN COMPARISON:  Thoracic spine MRI today reported separately. PET-CT 11/15/2019. FINDINGS: Segmentation: Normal, concordant with the thoracic spine numbering today. Alignment:  Preserved lumbar lordosis.  No spondylolisthesis. Vertebrae: Diffuse bone metastases throughout the visible lumbosacral spine. Vertebral body metastases at all levels. Posterior element metastases also at the L2, S1 levels. No pathologic fracture identified. Confluent metastases also in the left sacral ala and right iliac wing. No lumbar epidural tumor identified. No upper sacral epidural tumor identified. Conus medullaris and cauda equina: Conus extends to the L1 level. No lower  spinal cord or conus signal abnormality. No abnormal intradural enhancement. No dural thickening. Paraspinal and other soft tissues: Benign left renal parapelvic cysts. Mildly distended urinary bladder. Disc levels: No age advanced lumbar spine degeneration. Mild degenerative spinal stenosis at L3-L4. IMPRESSION: 1. Widespread skeletal metastases diffusely involving the lumbar spine and visible sacrum. 2. No lumbar pathologic fracture or extraosseous tumor. 3. Mild for age lumbar spine degeneration. Electronically Signed   By: Genevie Ann M.D.   On: 12/05/2019 23:51     Assessment and plan- Patient is a 84 y.o. 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  This is a routine follow-up of prostate cancer and to receive his second dose of Firmagon  PSA from today is currently pending.  He will receive his second dose of Firmagon today.  I will see him back in 1 month and switch him to Lupron at that time.Patient completes palliative radiation to his back next week and will start taking Zytiga 1000 mg daily along with prednisone following that.  Again discussed risks and benefits of Zytiga including all but not limited to  hypertension, leg swelling, fatigue, electrolyte disturbances and diarrhea.  Treatment will be given with a palliative intent.  Patient understands and agrees to proceed as planned.  Neoplasm related pain: Improving with palliative radiation.  Continue as needed oxycodone  I will see him back in 1 month with CBC with differential CMP and PSA for Firmagon and to see how he is tolerating Zytiga  Visit Diagnosis 1. Metastatic castration-sensitive adenocarcinoma of prostate ([place])   2. High risk medication use   3. Androgen deprivation therapy   4. Neoplasm related pain      Dr. Randa Evens, MD, MPH [facility] [phone] 01/03/2020 1:09 PM

## 2020-01-03 NOTE — Progress Notes (Signed)
[place]  Telephone:([phone] [phone]  Patient Care Team: [name], MD as PCP - General (Family Medicine)   Name of the patient: Alexis Cruz  [id number]  1935-04-30   Date of visit: 01/03/20  HPI: Patient Is a 84 y.o. male with metastatic castrate sensitive prostate cancer. Plan to start treatment with Zytiga and prednisone in combination with ADT therapy.  Reason for Consult: Oral chemotherapy Zytiga new start education.   PAST MEDICAL HISTORY: Past Medical History:  Diagnosis Date  . Hypertension     PAST SURGICAL HISTORY: No past surgical history on file.  HEMATOLOGY/ONCOLOGY HISTORY:  Oncology History   No history exists.    ALLERGIES:  is allergic to citalopram.  MEDICATIONS:  Current Outpatient Medications  Medication Sig Dispense Refill  . abiraterone acetate (ZYTIGA) 250 MG tablet Take 4 tablets (1,000 mg total) by mouth daily. Take on an empty stomach 1 hour before or 2 hours after a meal (Patient not taking: Reported on 01/03/2020) 120 tablet 0  . Cholecalciferol 125 MCG (5000 UT) capsule Take 5,000 Units by mouth once a week.    . clonazePAM (KLONOPIN) 0.5 MG tablet Take 1 tablet (0.5 mg total) by mouth 2 (two) times daily as needed. 60 tablet 0  . ibuprofen (ADVIL) 200 MG tablet Take 200 mg by mouth 2 (two) times daily as needed.    Marland Kitchen lisinopril-hydrochlorothiazide (ZESTORETIC) 20-12.5 MG tablet Take 1 tablet by mouth daily. 30 tablet 0  . oxyCODONE (OXY IR/ROXICODONE) 5 MG immediate release tablet Take 1 tablet (5 mg total) by mouth every 6 (six) hours as needed for severe pain. 120 tablet 0  . predniSONE (DELTASONE) 10 MG tablet Take 5 mg by mouth daily.    . Zinc 50 MG CAPS Take 1 capsule by mouth daily. (Patient not taking: Reported on 01/03/2020)     No current facility-administered medications for this visit.   Facility-Administered Medications Ordered in Other Visits    Medication Dose Route Frequency Provider Last Rate Last Admin  . degarelix (FIRMAGON) injection 80 mg  80 mg Subcutaneous Once Sindy Guadeloupe, MD        VITAL SIGNS: There were no vitals taken for this visit. There were no vitals filed for this visit.  Estimated body mass index is 27.67 kg/m as calculated from the following:   Height as of 11/28/19: 5\' 5"  (1.651 m).   Weight as of an earlier encounter on 01/03/20: 75.4 kg (166 lb 4.8 oz).  LABS: CBC:    Component Value Date/Time   WBC 4.9 01/03/2020 0829   HGB 9.7 (L) 01/03/2020 0829   HCT 29.3 (L) 01/03/2020 0829   PLT 153 01/03/2020 0829   MCV 92.4 01/03/2020 0829   NEUTROABS 3.1 01/03/2020 0829   LYMPHSABS 0.8 01/03/2020 0829   MONOABS 0.9 01/03/2020 0829   EOSABS 0.0 01/03/2020 0829   BASOSABS 0.0 01/03/2020 0829   Comprehensive Metabolic Panel:    Component Value Date/Time   NA 136 01/03/2020 0829   K 4.6 01/03/2020 0829   CL 103 01/03/2020 0829   CO2 25 01/03/2020 0829   BUN 29 (H) 01/03/2020 0829   CREATININE 1.17 01/03/2020 0829   GLUCOSE 108 (H) 01/03/2020 0829   CALCIUM 9.4 01/03/2020 0829   AST 22 01/03/2020 0829   ALT 17 01/03/2020 0829   ALKPHOS 119 01/03/2020 0829   BILITOT 0.9 01/03/2020 0829   PROT 7.5 01/03/2020 0829   ALBUMIN 4.2 01/03/2020 0829  RADIOGRAPHIC STUDIES: MR Thoracic Spine W Wo Contrast  Result Date: 12/05/2019 CLINICAL DATA:  84 year old male with widespread bony metastatic disease, CT-guided bone biopsy earlier this month suggesting metastatic prostate adenocarcinoma on pathology analysis. EXAM: MRI THORACIC WITHOUT AND WITH CONTRAST TECHNIQUE: Multiplanar and multiecho pulse sequences of the thoracic spine were obtained without and with intravenous contrast. CONTRAST:  97mL GADAVIST GADOBUTROL 1 MMOL/ML IV SOLN COMPARISON:  PET-CT 11/15/2019. FINDINGS: Limited cervical spine imaging: Evidence of abnormal marrow replacement by tumor in the cervical spine C4 through C7 vertebrae. Some  superimposed cervical spine degeneration. Thoracic spine segmentation:  Appears to be normal. Alignment: No thoracic spondylolisthesis. Mildly exaggerated upper thoracic kyphosis related to the T3 finding. Vertebrae: Diffuse thoracic bone metastases. All thoracic vertebral bodies are affected. Conspicuous posterior element metastases also at T3, T5, T8, T10, and T11. Pathologic compression fracture of T3 with retropulsed bone and/or tumor. Central T3 vertebral body loss of height of 60%. Associated mild spinal stenosis but no cord compression at this time (series 23, image 9 and series 19, image 9). There is ventral epidural space extension of tumor from the left T11 vertebral body involvement (series 24, image 32). No spinal stenosis at that level (series 23, image 32). No foraminal involvement. No other thoracic epidural tumor extension identified. Bilateral rib metastases. Lumbar spine metastases reported separately today. Cord: Thoracic spinal cord remains normal at this time. Normal conus medullaris at T12-L1. No abnormal intradural enhancement. No dural thickening. Paraspinal and other soft tissues: Negative. Disc levels: No age advanced thoracic spine degeneration. IMPRESSION: 1. Widespread skeletal metastases diffusely involving the thoracic vertebrae. 2. Pathologic compression fracture of T3 with 60% loss of height and retropulsion resulting in mild spinal stenosis. And mild extension of T11 tumor into the left ventral epidural space. But no spinal cord compression at this time. Electronically Signed   By: Genevie Ann M.D.   On: 12/05/2019 23:46   MR Lumbar Spine W Wo Contrast  Result Date: 12/05/2019 CLINICAL DATA:  84 year old male with widespread bony metastatic disease, CT-guided bone biopsy earlier this month suggesting metastatic prostate adenocarcinoma on pathology analysis. EXAM: MRI LUMBAR SPINE WITHOUT AND WITH CONTRAST TECHNIQUE: Multiplanar and multiecho pulse sequences of the lumbar spine were  obtained without and with intravenous contrast. CONTRAST:  35mL GADAVIST GADOBUTROL 1 MMOL/ML IV SOLN COMPARISON:  Thoracic spine MRI today reported separately. PET-CT 11/15/2019. FINDINGS: Segmentation: Normal, concordant with the thoracic spine numbering today. Alignment:  Preserved lumbar lordosis.  No spondylolisthesis. Vertebrae: Diffuse bone metastases throughout the visible lumbosacral spine. Vertebral body metastases at all levels. Posterior element metastases also at the L2, S1 levels. No pathologic fracture identified. Confluent metastases also in the left sacral ala and right iliac wing. No lumbar epidural tumor identified. No upper sacral epidural tumor identified. Conus medullaris and cauda equina: Conus extends to the L1 level. No lower spinal cord or conus signal abnormality. No abnormal intradural enhancement. No dural thickening. Paraspinal and other soft tissues: Benign left renal parapelvic cysts. Mildly distended urinary bladder. Disc levels: No age advanced lumbar spine degeneration. Mild degenerative spinal stenosis at L3-L4. IMPRESSION: 1. Widespread skeletal metastases diffusely involving the lumbar spine and visible sacrum. 2. No lumbar pathologic fracture or extraosseous tumor. 3. Mild for age lumbar spine degeneration. Electronically Signed   By: Genevie Ann M.D.   On: 12/05/2019 23:51     Assessment and Plan-  Plan for patient to start Zytiga following the completion of his radiation treatment next week. Patient has medication in hand  and ready to start.   Patient Education I spoke with patient and wife following MD visit for overview of new oral chemotherapy medication: Zytiga for the treatment of metastatic prostate cancer, planned duration until disease progression or unacceptable drug toxicity.   Pt is doing well. Counseled patient on administration, dosing, side effects, monitoring, drug-food interactions, safe handling, storage, and disposal. Patient will take 1000 mg (4  tablets) once daily without food and prednisone 5 mg once daily (pre-existing medication for the patient).  Side effects include but not limited to: fatigue, electrolyte abnormalities, hypertension, decrease in liver function.  Reviewed with patient importance of keeping a medication schedule and plan for any missed doses.  After discussion with patient no patient barriers to medication adherence identified.   Mr and Mrs Chervenak voiced understanding and appreciation. All questions answered. Medication handout provided.  Provided patient with [facility] phone number. Patient knows to call the office with questions or concerns. [facility] will continue to follow.  Patient expressed understanding and was in agreement with this plan. He also understands that He can call clinic at any time with any questions, concerns, or complaints.   Thank you for allowing me to participate in the care of this very pleasant patient.   Time Total: 15 minutes  Visit consisted of counseling and education on dealing with issues of symptom management in the setting of serious and potentially life-threatening illness.Greater than 50%  of this time was spent counseling and coordinating care related to the above assessment and plan.  Signed by: Darl Pikes, PharmD, BCPS, Salley Slaughter, CPP Hematology/Oncology Clinical Pharmacist Practitioner [facility] [phone]  01/03/2020 9:29 AM

## 2020-01-04 ENCOUNTER — Ambulatory Visit
Admission: RE | Admit: 2020-01-04 | Discharge: 2020-01-04 | Disposition: A | Discharge status: Home / Self Care | Payer: Medicare Other | Source: Ambulatory Visit | Attending: Radiation Oncology | Admitting: Radiation Oncology

## 2020-01-04 DIAGNOSIS — C7951 Secondary malignant neoplasm of bone: Secondary | ICD-10-CM | POA: Diagnosis not present

## 2020-01-07 ENCOUNTER — Ambulatory Visit
Admission: RE | Admit: 2020-01-07 | Discharge: 2020-01-07 | Disposition: A | Discharge status: Home / Self Care | Payer: Medicare Other | Source: Ambulatory Visit | Attending: Radiation Oncology | Admitting: Radiation Oncology

## 2020-01-07 DIAGNOSIS — C7951 Secondary malignant neoplasm of bone: Secondary | ICD-10-CM | POA: Diagnosis not present

## 2020-01-08 ENCOUNTER — Ambulatory Visit
Admission: RE | Admit: 2020-01-08 | Discharge: 2020-01-08 | Disposition: A | Discharge status: Home / Self Care | Payer: Medicare Other | Source: Ambulatory Visit | Attending: Radiation Oncology | Admitting: Radiation Oncology

## 2020-01-08 DIAGNOSIS — C7951 Secondary malignant neoplasm of bone: Secondary | ICD-10-CM | POA: Diagnosis not present

## 2020-01-09 ENCOUNTER — Ambulatory Visit
Admission: RE | Admit: 2020-01-09 | Discharge: 2020-01-09 | Disposition: A | Discharge status: Home / Self Care | Payer: Medicare Other | Source: Ambulatory Visit | Attending: Radiation Oncology | Admitting: Radiation Oncology

## 2020-01-09 DIAGNOSIS — C7951 Secondary malignant neoplasm of bone: Secondary | ICD-10-CM | POA: Diagnosis not present

## 2020-01-10 ENCOUNTER — Ambulatory Visit
Admission: RE | Admit: 2020-01-10 | Discharge: 2020-01-10 | Disposition: A | Discharge status: Home / Self Care | Payer: Medicare Other | Source: Ambulatory Visit | Attending: Radiation Oncology | Admitting: Radiation Oncology

## 2020-01-10 DIAGNOSIS — C7951 Secondary malignant neoplasm of bone: Secondary | ICD-10-CM | POA: Diagnosis not present

## 2020-01-11 ENCOUNTER — Ambulatory Visit
Admission: RE | Admit: 2020-01-11 | Discharge: 2020-01-11 | Disposition: A | Discharge status: Home / Self Care | Payer: Medicare Other | Source: Ambulatory Visit | Attending: Radiation Oncology | Admitting: Radiation Oncology

## 2020-01-11 DIAGNOSIS — C7951 Secondary malignant neoplasm of bone: Secondary | ICD-10-CM | POA: Diagnosis not present

## 2020-01-14 ENCOUNTER — Ambulatory Visit
Admission: RE | Admit: 2020-01-14 | Discharge: 2020-01-14 | Disposition: A | Discharge status: Home / Self Care | Payer: Medicare Other | Source: Ambulatory Visit | Attending: Radiation Oncology | Admitting: Radiation Oncology

## 2020-01-14 ENCOUNTER — Other Ambulatory Visit: Payer: Self-pay | Admitting: Licensed Clinical Social Worker

## 2020-01-14 DIAGNOSIS — C7951 Secondary malignant neoplasm of bone: Secondary | ICD-10-CM | POA: Diagnosis not present

## 2020-01-14 MED ORDER — SUCRALFATE 1 G PO TABS: 1.0000 g | ORAL_TABLET | Freq: Two times a day (BID) | ORAL | 0 refills | Status: DC

## 2020-01-15 ENCOUNTER — Encounter: Payer: Self-pay | Admitting: Oncology

## 2020-01-18 ENCOUNTER — Encounter: Payer: Self-pay | Admitting: Oncology

## 2020-01-18 ENCOUNTER — Other Ambulatory Visit: Payer: Self-pay | Admitting: *Deleted

## 2020-01-18 MED ORDER — ONDANSETRON HCL 4 MG PO TABS: 4.0000 mg | ORAL_TABLET | Freq: Four times a day (QID) | ORAL | 0 refills | Status: DC | PRN

## 2020-02-04 ENCOUNTER — Other Ambulatory Visit: Payer: Self-pay

## 2020-02-04 ENCOUNTER — Inpatient Hospital Stay: Payer: Medicare Other

## 2020-02-04 ENCOUNTER — Inpatient Hospital Stay: Payer: Medicare Other | Attending: Oncology | Admitting: Oncology

## 2020-02-04 ENCOUNTER — Encounter: Payer: Self-pay | Admitting: Oncology

## 2020-02-04 ENCOUNTER — Inpatient Hospital Stay: Payer: Medicare Other | Admitting: Pharmacist

## 2020-02-04 ENCOUNTER — Other Ambulatory Visit: Payer: Self-pay | Admitting: Pharmacist

## 2020-02-04 VITALS — BP 117/58 | HR 83 | Temp 97.3°F | Resp 16 | Wt 162.0 lb

## 2020-02-04 DIAGNOSIS — Z191 Hormone sensitive malignancy status: Secondary | ICD-10-CM

## 2020-02-04 DIAGNOSIS — Z5111 Encounter for antineoplastic chemotherapy: Secondary | ICD-10-CM | POA: Insufficient documentation

## 2020-02-04 DIAGNOSIS — D649 Anemia, unspecified: Secondary | ICD-10-CM

## 2020-02-04 DIAGNOSIS — C7951 Secondary malignant neoplasm of bone: Secondary | ICD-10-CM | POA: Diagnosis not present

## 2020-02-04 DIAGNOSIS — R5383 Other fatigue: Secondary | ICD-10-CM

## 2020-02-04 DIAGNOSIS — C61 Malignant neoplasm of prostate: Secondary | ICD-10-CM

## 2020-02-04 DIAGNOSIS — Z79818 Long term (current) use of other agents affecting estrogen receptors and estrogen levels: Secondary | ICD-10-CM

## 2020-02-04 DIAGNOSIS — Z5181 Encounter for therapeutic drug level monitoring: Secondary | ICD-10-CM | POA: Diagnosis not present

## 2020-02-04 DIAGNOSIS — Z79899 Other long term (current) drug therapy: Secondary | ICD-10-CM | POA: Diagnosis not present

## 2020-02-04 DIAGNOSIS — F419 Anxiety disorder, unspecified: Secondary | ICD-10-CM

## 2020-02-04 DIAGNOSIS — D630 Anemia in neoplastic disease: Secondary | ICD-10-CM

## 2020-02-04 LAB — COMPREHENSIVE METABOLIC PANEL
ALT: 16 U/L (ref 0–44)
AST: 21 U/L (ref 15–41)
Albumin: 3.7 g/dL (ref 3.5–5.0)
Alkaline Phosphatase: 104 U/L (ref 38–126)
Anion gap: 8 (ref 5–15)
BUN: 22 mg/dL (ref 8–23)
CO2: 25 mmol/L (ref 22–32)
Calcium: 9.3 mg/dL (ref 8.9–10.3)
Chloride: 102 mmol/L (ref 98–111)
Creatinine, Ser: 1.17 mg/dL (ref 0.61–1.24)
GFR, Estimated: >60 mL/min (ref >60)
Glucose, Bld: 112 mg/dL — ABNORMAL HIGH (ref 70–99)
Potassium: 3.5 mmol/L (ref 3.5–5.1)
Sodium: 135 mmol/L (ref 135–145)
Total Bilirubin: 0.8 mg/dL (ref 0.3–1.2)
Total Protein: 7.5 g/dL (ref 6.5–8.1)

## 2020-02-04 LAB — CBC WITH DIFFERENTIAL/PLATELET
Abs Immature Granulocytes: 0.13 10*3/uL — ABNORMAL HIGH (ref 0.00–0.07)
Basophils Absolute: 0 10*3/uL (ref 0.0–0.1)
Basophils Relative: 0 %
Eosinophils Absolute: 0.2 10*3/uL (ref 0.0–0.5)
Eosinophils Relative: 2 %
HCT: 27.4 % — ABNORMAL LOW (ref 39.0–52.0)
Hemoglobin: 9.3 g/dL — ABNORMAL LOW (ref 13.0–17.0)
Immature Granulocytes: 2 %
Lymphocytes Relative: 18 %
Lymphs Abs: 1.2 10*3/uL (ref 0.7–4.0)
MCH: 31.4 pg (ref 26.0–34.0)
MCHC: 33.9 g/dL (ref 30.0–36.0)
MCV: 92.6 fL (ref 80.0–100.0)
Monocytes Absolute: 1.1 10*3/uL — ABNORMAL HIGH (ref 0.1–1.0)
Monocytes Relative: 15 %
Neutro Abs: 4.4 10*3/uL (ref 1.7–7.7)
Neutrophils Relative %: 63 %
Platelets: 147 10*3/uL — ABNORMAL LOW (ref 150–400)
RBC: 2.96 MIL/uL — ABNORMAL LOW (ref 4.22–5.81)
RDW: 17.2 % — ABNORMAL HIGH (ref 11.5–15.5)
WBC: 7 10*3/uL (ref 4.0–10.5)
nRBC: 0 % (ref 0.0–0.2)

## 2020-02-04 LAB — IRON AND TIBC
Iron: 88 ug/dL (ref 45–182)
Saturation Ratios: 29 % (ref 17.9–39.5)
TIBC: 308 ug/dL (ref 250–450)
UIBC: 220 ug/dL

## 2020-02-04 LAB — RETICULOCYTES
Immature Retic Fract: 38.9 % — ABNORMAL HIGH (ref 2.3–15.9)
RBC.: 3.05 MIL/uL — ABNORMAL LOW (ref 4.22–5.81)
Retic Count, Absolute: 185.4 10*3/uL (ref 19.0–186.0)
Retic Ct Pct: 6.1 % — ABNORMAL HIGH (ref 0.4–3.1)

## 2020-02-04 LAB — PSA: Prostatic Specific Antigen: 0.26 ng/mL (ref 0.00–4.00)

## 2020-02-04 LAB — VITAMIN B12: Vitamin B-12: 465 pg/mL (ref 180–914)

## 2020-02-04 LAB — FERRITIN: Ferritin: 829 ng/mL — ABNORMAL HIGH (ref 24–336)

## 2020-02-04 LAB — FOLATE: Folate: 9.9 ng/mL (ref >5.9)

## 2020-02-04 LAB — TSH: TSH: 1.568 u[IU]/mL (ref 0.350–4.500)

## 2020-02-04 MED ORDER — CLONAZEPAM 0.5 MG PO TABS
0.5000 mg | ORAL_TABLET | Freq: Two times a day (BID) | ORAL | 0 refills | Status: DC | PRN
Start: 2020-02-04 — End: 2020-04-25

## 2020-02-04 MED ORDER — ABIRATERONE ACETATE 250 MG PO TABS: 1000.0000 mg | ORAL_TABLET | Freq: Every day | ORAL | 1 refills | Status: DC

## 2020-02-04 MED ORDER — LEUPROLIDE ACETATE (3 MONTH) 22.5 MG ~~LOC~~ KIT
22.5000 mg | PACK | SUBCUTANEOUS | Status: DC
  Administered 2020-02-04: 22.5 mg via SUBCUTANEOUS
  Filled 2020-02-04: qty 22.5

## 2020-02-04 NOTE — Progress Notes (Signed)
[place]  Telephone:([phone] [phone]  Patient Care Team: [name], MD as PCP - General (Family Medicine)   Name of the patient: Alexis Cruz  [id number]  1935/06/24   Date of visit: 02/04/20  HPI: Patient is a 84 y.o. male  with metastatic castrate sensitive prostate cancer. Started on treatment with Zytiga and prednisone in combination with ADT therapy. Patient reported starting medication approximately 2 weeks ago. Treatment initiation was delayed by 1 week due to swallowing issues which have resolved.  Reason for Consult: Oral chemotherapy follow-up for Zytiga (abiraterone) therapy.   PAST MEDICAL HISTORY: Past Medical History:  Diagnosis Date  . Hypertension   . Prostate cancer ([place])     PAST SURGICAL HISTORY: No past surgical history on file.  HEMATOLOGY/ONCOLOGY HISTORY:  Oncology History   No history exists.    ALLERGIES:  is allergic to citalopram.  MEDICATIONS:  Current Outpatient Medications  Medication Sig Dispense Refill  . abiraterone acetate (ZYTIGA) 250 MG tablet Take 4 tablets (1,000 mg total) by mouth daily. Take on an empty stomach 1 hour before or 2 hours after a meal 120 tablet 1  . Cholecalciferol 125 MCG (5000 UT) capsule Take 5,000 Units by mouth once a week.    . clonazePAM (KLONOPIN) 0.5 MG tablet Take 1 tablet (0.5 mg total) by mouth 2 (two) times daily as needed. 60 tablet 0  . ibuprofen (ADVIL) 200 MG tablet Take 200 mg by mouth 2 (two) times daily as needed.    Marland Kitchen lisinopril-hydrochlorothiazide (ZESTORETIC) 20-12.5 MG tablet Take 1 tablet by mouth daily. 30 tablet 0  . ondansetron (ZOFRAN) 4 MG tablet Take 1 tablet (4 mg total) by mouth every 6 (six) hours as needed for nausea or vomiting. (Patient not taking: Reported on 02/04/2020) 30 tablet 0  . oxyCODONE (OXY IR/ROXICODONE) 5 MG immediate release tablet Take 1 tablet (5 mg total) by mouth every 6 (six) hours  as needed for severe pain. (Patient not taking: Reported on 02/04/2020) 120 tablet 0  . predniSONE (DELTASONE) 5 MG tablet Take 1 tablet (5 mg total) by mouth daily with breakfast. 30 tablet 3  . Zinc 50 MG CAPS Take 1 capsule by mouth daily. (Patient not taking: Reported on 01/03/2020)     No current facility-administered medications for this visit.   Facility-Administered Medications Ordered in Other Visits  Medication Dose Route Frequency Provider Last Rate Last Admin  . Leuprolide Acetate (3 Month) (ELIGARD) 22.5 MG injection 22.5 mg  22.5 mg Subcutaneous Q90 days Sindy Guadeloupe, MD   22.5 mg at 02/04/20 1100    VITAL SIGNS: There were no vitals taken for this visit. There were no vitals filed for this visit.  Estimated body mass index is 26.96 kg/m as calculated from the following:   Height as of 11/28/19: 5\' 5"  (1.651 m).   Weight as of an earlier encounter on 02/04/20: 73.5 kg (162 lb).  LABS: CBC:    Component Value Date/Time   WBC 7.0 02/04/2020 0929   HGB 9.3 (L) 02/04/2020 0929   HCT 27.4 (L) 02/04/2020 0929   PLT 147 (L) 02/04/2020 0929   MCV 92.6 02/04/2020 0929   NEUTROABS 4.4 02/04/2020 0929   LYMPHSABS 1.2 02/04/2020 0929   MONOABS 1.1 (H) 02/04/2020 0929   EOSABS 0.2 02/04/2020 0929   BASOSABS 0.0 02/04/2020 0929   Comprehensive Metabolic Panel:    Component Value Date/Time   NA 135 02/04/2020 0929   K 3.5  02/04/2020 0929   CL 102 02/04/2020 0929   CO2 25 02/04/2020 0929   BUN 22 02/04/2020 0929   CREATININE 1.17 02/04/2020 0929   GLUCOSE 112 (H) 02/04/2020 0929   CALCIUM 9.3 02/04/2020 0929   AST 21 02/04/2020 0929   ALT 16 02/04/2020 0929   ALKPHOS 104 02/04/2020 0929   BILITOT 0.8 02/04/2020 0929   PROT 7.5 02/04/2020 0929   ALBUMIN 3.7 02/04/2020 0929    RADIOGRAPHIC STUDIES: No results found.   Assessment and Plan-  Plan to continue abiraterone and prednisone treatment.    Oral Chemotherapy Side Effect/Intolerance: denied diarrhea and  edema. Reports having more issues with constipation which he manages with OTC products. Pt has not noticed a decrease in his energy level although he has a baseline low level of activity. Pt reports occasional dizziness/confusion upon standing and walking. Encouraged the patient to stand up slowly and pause before moving when this occurs. They will continue to keep an eye on how often this occurs. Pt keeps blood pressure log at home and reports normal BP readings.  Oral Chemotherapy Adherence: No reported missed doses.  Medication Access Issues: Refill prescription sent to [facility] outpatient pharmacy. Refill scheduled to ship on 11/18.  0 patient barriers to medication adherence identified.   Patient expressed understanding and was in agreement with this plan. He also understands that He can call clinic at any time with any questions, concerns, or complaints.   Thank you for allowing me to participate in the care of this very pleasant patient.   Time Total: 15 minutes  Visit consisted of counseling and education on dealing with issues of symptom management in the setting of serious and potentially life-threatening illness.Greater than 50%  of this time was spent counseling and coordinating care related to the above assessment and plan.  Signed by: Darl Pikes, PharmD, BCPS, Salley Slaughter, CPP Hematology/Oncology Clinical Pharmacist Practitioner [facility] [phone]  02/04/2020 11:04 AM

## 2020-02-04 NOTE — Progress Notes (Signed)
Hematology/Oncology Consult note [facility]  Telephone:([phone] [phone]  Patient Care Team: [name], MD as PCP - General (Family Medicine)   Name of the patient: Alexis Cruz  [id number]  01-26-1936   Date of visit: 02/04/20  Diagnosis- metastatic prostate cancer castration sensitive with bone metastases  Chief complaint/ Reason for visit-routine follow-up of prostate cancer on Zytiga  Heme/Onc history: Patient is a 84 year old Caucasian gentleman who was initially seen by rheumatology Dr. Posey Pronto for symptoms of left hip pain which prompted x-rays followed by MRI of the left hip without contrast. 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 Patient was subsequently seen by orthopedics Dr. Pathology who did not recommend any orthopedic intervention for the left hip fracture he has been referred to oncology for further management patient's last PSA was checked in 2014 which was elevated at 7.4. Patient is also a chronic smoker and smokes about 1 pack of cigarettes per day since 1968.  Head CT scan showed widespread bony metastatic disease with pathologic fracture of the left acetabulum. Pathologic fracture of the T3 vertebral body with 50% loss of height possible fracture of the left seventh rib.No evidence of visceral metastases. PSA elevated at 35.Bone biopsy consistent with prostate adenocarcinoma  Patient received Mills Koller and is currently on Zytiga   Interval history-patient reports feeling more fatigue and occasional episodes of dizziness.  He pretty much spends his time in the recliner and mainly gets up to go to the restroom.  Denies any back pain or significant hip pain and is not taking any pain medications.  Uses a dose of clonazepam at night to help him with his anxiety.   ECOG PS- 2 Pain scale- 0 Opioid associated constipation- mo  Review of systems- Review of Systems  Constitutional: Positive for malaise/fatigue. Negative for chills, fever and weight loss.  HENT: Negative for congestion, ear discharge and nosebleeds.   Eyes: Negative for blurred vision.  Respiratory: Negative for cough, hemoptysis, sputum production, shortness of breath and wheezing.   Cardiovascular: Negative for chest pain, palpitations, orthopnea and claudication.  Gastrointestinal: Negative for abdominal pain, blood in stool, constipation, diarrhea, heartburn, melena, nausea and vomiting.  Genitourinary: Negative for dysuria, flank pain, frequency, hematuria and urgency.  Musculoskeletal: Negative for back pain, joint pain and myalgias.  Skin: Negative for rash.  Neurological: Negative for dizziness, tingling, focal weakness, seizures, weakness and headaches.  Endo/Heme/Allergies: Does not bruise/bleed easily.  Psychiatric/Behavioral: Negative for depression and suicidal ideas. The patient does not have insomnia.       Allergies  Allergen Reactions  . Citalopram Other (See Comments)    Unsure but family remembers that he could not take the dru-not sure what happened     Past Medical History:  Diagnosis Date  . Hypertension   . Prostate cancer [facility])      History reviewed. No pertinent surgical history.  Social History   Socioeconomic History  . Marital status: Married    Spouse name: Not on file  . Number of children: Not on file  . Years of education: Not on file  . Highest education level: Not on file  Occupational History  . Not on file  Tobacco Use  . Smoking status: Former Smoker    Types: Pipe  . Smokeless tobacco: Never Used  . Tobacco comment: no pipes in over a month  Vaping Use  .  Vaping Use: Never used  Substance and Sexual Activity  . Alcohol use: Not Currently  . Drug use: Never  . Sexual activity: Not on file  Other Topics Concern  . Not on  file  Social History Narrative  . Not on file   Social Determinants of Health   Financial Resource Strain:   . Difficulty of Paying Living Expenses: Not on file  Food Insecurity:   . Worried About Charity fundraiser in the Last Year: Not on file  . Ran Out of Food in the Last Year: Not on file  Transportation Needs:   . Lack of Transportation (Medical): Not on file  . Lack of Transportation (Non-Medical): Not on file  Physical Activity:   . Days of Exercise per Week: Not on file  . Minutes of Exercise per Session: Not on file  Stress:   . Feeling of Stress : Not on file  Social Connections:   . Frequency of Communication with Friends and Family: Not on file  . Frequency of Social Gatherings with Friends and Family: Not on file  . Attends Religious Services: Not on file  . Active Member of Clubs or Organizations: Not on file  . Attends Archivist Meetings: Not on file  . Marital Status: Not on file  Intimate Partner Violence:   . Fear of Current or Ex-Partner: Not on file  . Emotionally Abused: Not on file  . Physically Abused: Not on file  . Sexually Abused: Not on file    History reviewed. No pertinent family history.   Current Outpatient Medications:  .  abiraterone acetate (ZYTIGA) 250 MG tablet, Take 4 tablets (1,000 mg total) by mouth daily. Take on an empty stomach 1 hour before or 2 hours after a meal, Disp: 120 tablet, Rfl: 0 .  Cholecalciferol 125 MCG (5000 UT) capsule, Take 5,000 Units by mouth once a week., Disp: , Rfl:  .  clonazePAM (KLONOPIN) 0.5 MG tablet, Take 1 tablet (0.5 mg total) by mouth 2 (two) times daily as needed., Disp: 60 tablet, Rfl: 0 .  ibuprofen (ADVIL) 200 MG tablet, Take 200 mg by mouth 2 (two) times daily as needed., Disp: , Rfl:  .  lisinopril-hydrochlorothiazide (ZESTORETIC) 20-12.5 MG tablet, Take 1 tablet by mouth daily., Disp: 30 tablet, Rfl: 0 .  predniSONE (DELTASONE) 5 MG tablet, Take 1 tablet (5 mg total) by mouth daily  with breakfast., Disp: 30 tablet, Rfl: 3 .  ondansetron (ZOFRAN) 4 MG tablet, Take 1 tablet (4 mg total) by mouth every 6 (six) hours as needed for nausea or vomiting. (Patient not taking: Reported on 02/04/2020), Disp: 30 tablet, Rfl: 0 .  oxyCODONE (OXY IR/ROXICODONE) 5 MG immediate release tablet, Take 1 tablet (5 mg total) by mouth every 6 (six) hours as needed for severe pain. (Patient not taking: Reported on 02/04/2020), Disp: 120 tablet, Rfl: 0 .  Zinc 50 MG CAPS, Take 1 capsule by mouth daily. (Patient not taking: Reported on 01/03/2020), Disp: , Rfl:  No current facility-administered medications for this visit.  Facility-Administered Medications Ordered in Other Visits:  .  Leuprolide Acetate (3 Month) (ELIGARD) 22.5 MG injection 22.5 mg, 22.5 mg, Subcutaneous, Q90 days, Sindy Guadeloupe, MD  Physical exam:  Vitals:   02/04/20 1000  BP: (!) 117/58  Pulse: 83  Resp: 16  Temp: (!) 97.3 F (36.3 C)  TempSrc: Tympanic  SpO2: 99%  Weight: 162 lb (73.5 kg)   Physical Exam Constitutional:  General: He is not in acute distress.    Comments: Sitting in a wheelchair.  Appears mildly fatigued.  No acute distress  Cardiovascular:     Rate and Rhythm: Normal rate and regular rhythm.     Heart sounds: Normal heart sounds.  Pulmonary:     Effort: Pulmonary effort is normal.     Breath sounds: Normal breath sounds.  Abdominal:     General: Bowel sounds are normal.     Palpations: Abdomen is soft.  Skin:    General: Skin is warm and dry.  Neurological:     Mental Status: He is alert and oriented to person, place, and time.      CMP Latest Ref Rng & Units 02/04/2020  Glucose 70 - 99 mg/dL 112(H)  BUN 8 - 23 mg/dL 22  Creatinine 0.61 - 1.24 mg/dL 1.17  Sodium 135 - 145 mmol/L 135  Potassium 3.5 - 5.1 mmol/L 3.5  Chloride 98 - 111 mmol/L 102  CO2 22 - 32 mmol/L 25  Calcium 8.9 - 10.3 mg/dL 9.3  Total Protein 6.5 - 8.1 g/dL 7.5  Total Bilirubin 0.3 - 1.2 mg/dL 0.8  Alkaline  Phos 38 - 126 U/L 104  AST 15 - 41 U/L 21  ALT 0 - 44 U/L 16   CBC Latest Ref Rng & Units 02/04/2020  WBC 4.0 - 10.5 K/uL 7.0  Hemoglobin 13.0 - 17.0 g/dL 9.3(L)  Hematocrit 39 - 52 % 27.4(L)  Platelets 150 - 400 K/uL 147(L)      Assessment and plan- Patient is a 84 y.o. male with high risk castrate sensitive metastatic prostate cancer with bone metastases currently on ADT plus Zytiga here for routine follow-up  PSA from today is currently pending.  His last PSA was down to 2.5 from a prior value of 35.  He will receive Lupron today and will continue to get it every 3 months.  He will also continue taking Zytiga along with prednisone daily.  Normocytic anemia: We will check ferritin and iron studies B12 folate and reticulocyte count and TSH today.  Anxiety: I have refilled his clonazepam.  Fatigue: Possibly secondary to Zytiga and ADT.  I have encouraged him to walk around the house with a Rollator and do some minimal exercises and physical activity to combat fatigue.   Visit Diagnosis 1. Normocytic anemia   2. High risk medication use   3. Encounter for monitoring Lupron therapy   4. Anxiety   5. Bone metastases ([place])   6. Prostate cancer metastatic to bone ([place])   7. Fatigue, unspecified type      Dr. Randa Evens, MD, MPH [facility] [phone] 02/04/2020 10:57 AM

## 2020-02-07 ENCOUNTER — Ambulatory Visit: Payer: Medicare Other | Admitting: Oncology

## 2020-02-07 ENCOUNTER — Ambulatory Visit: Payer: Medicare Other

## 2020-02-07 ENCOUNTER — Other Ambulatory Visit: Payer: Medicare Other

## 2020-02-07 MED FILL — ABIRATERONE ACETATE 250 MG: 250 | 30 days supply | Qty: 120 | Fill #0

## 2020-02-21 ENCOUNTER — Ambulatory Visit
Admission: RE | Admit: 2020-02-21 | Discharge: 2020-02-21 | Disposition: A | Discharge status: Home / Self Care | Payer: Medicare Other | Source: Ambulatory Visit | Attending: Radiation Oncology | Admitting: Radiation Oncology

## 2020-02-21 ENCOUNTER — Other Ambulatory Visit: Payer: Self-pay | Admitting: *Deleted

## 2020-02-21 ENCOUNTER — Other Ambulatory Visit: Payer: Self-pay

## 2020-02-21 ENCOUNTER — Telehealth: Payer: Self-pay | Admitting: *Deleted

## 2020-02-21 ENCOUNTER — Encounter: Payer: Self-pay | Admitting: Radiation Oncology

## 2020-02-21 VITALS — BP 103/65 | HR 87 | Temp 97.7°F | Wt 164.0 lb

## 2020-02-21 DIAGNOSIS — C61 Malignant neoplasm of prostate: Secondary | ICD-10-CM | POA: Insufficient documentation

## 2020-02-21 DIAGNOSIS — I10 Essential (primary) hypertension: Secondary | ICD-10-CM

## 2020-02-21 DIAGNOSIS — Z923 Personal history of irradiation: Secondary | ICD-10-CM | POA: Insufficient documentation

## 2020-02-21 DIAGNOSIS — C7951 Secondary malignant neoplasm of bone: Secondary | ICD-10-CM

## 2020-02-21 MED ORDER — LISINOPRIL-HYDROCHLOROTHIAZIDE 20-12.5 MG PO TABS
1.0000 | ORAL_TABLET | Freq: Every day | ORAL | 0 refills | Status: DC
Start: 2020-02-21 — End: 2020-03-17

## 2020-02-21 NOTE — Telephone Encounter (Signed)
Got a message from [facility] in radiation, this is the last time they will see pt and the wife asked if we could refill b/p medicine and save them 45 min to get to PCP. Dr. Janese Banks said that is ok but to  Make sure that they are able to get in before rx ran out so that PCP monitors him for b/p. Wife states she will absolutely do that.Marland Kitchen

## 2020-02-21 NOTE — Progress Notes (Signed)
Radiation Oncology Follow up Note  Name: Alexis Cruz   Date:   02/21/2020 MRN:  [id number] DOB: [birth year]    This 84 y.o. male presents to the clinic today for 1 month follow-up status post palliative radiation therapy to his thoracic spine patient with known stage IV prostate cancer previously treated to his left hip.  REFERRING PROVIDER: Radiontchenko, Alexei, *  HPI: Patient is an 84 year old male with known stage IV prostate cancer is now 1 month out from palliative radiation therapy to T3 and T11.  Has had excellent palliative benefit from treatment.  He is really no pain at this point his left hip is also been previously treated and has completely responded as far as pain is concerned.  He is having no significant side effects no dysphagia.  He is having problems refilling his medications and we are referring that back to Dr. Elroy Channel team for that..  COMPLICATIONS OF TREATMENT: none  FOLLOW UP COMPLIANCE: keeps appointments   PHYSICAL EXAM:  BP 103/65   Pulse 87   Temp 97.7 F (36.5 C) (Tympanic)   Wt 164 lb (74.4 kg)   BMI 27.29 kg/m  Deep pain of his thoracic spine does not elicit pain range of motion is lower extremities does not elicit pain.  Well-developed well-nourished patient in NAD. HEENT reveals PERLA, EOMI, discs not visualized.  Oral cavity is clear. No oral mucosal lesions are identified. Neck is clear without evidence of cervical or supraclavicular adenopathy. Lungs are clear to A&P. Cardiac examination is essentially unremarkable with regular rate and rhythm without murmur rub or thrill. Abdomen is benign with no organomegaly or masses noted. Motor sensory and DTR levels are equal and symmetric in the upper and lower extremities. Cranial nerves II through XII are grossly intact. Proprioception is intact. No peripheral adenopathy or edema is identified. No motor or sensory levels are noted. Crude visual fields are within normal range.  RADIOLOGY RESULTS: No current  films to review  PLAN: Present time patient is achieved excellent palliation from his radiation therapy.  I am pleased with his overall progress.  I will turn follow-up care over to medical oncology.  Be happy to reevaluate the patient in time should further palliation be indicated.  Also referring him to Dr. Silvano Rusk for refill of some of his prescriptions including lisinopril.  Patient family know to call with any concerns.  I would like to take this opportunity to thank you for allowing me to participate in the care of your patient.Noreene Filbert, MD

## 2020-02-28 DIAGNOSIS — G479 Sleep disorder, unspecified: Secondary | ICD-10-CM | POA: Insufficient documentation

## 2020-02-28 DIAGNOSIS — C61 Malignant neoplasm of prostate: Secondary | ICD-10-CM | POA: Insufficient documentation

## 2020-03-10 MED FILL — ABIRATERONE ACETATE 250 MG: 250 | 30 days supply | Qty: 120 | Fill #1

## 2020-03-17 ENCOUNTER — Other Ambulatory Visit: Payer: Self-pay | Admitting: Oncology

## 2020-03-28 ENCOUNTER — Encounter: Payer: Self-pay | Admitting: Oncology

## 2020-04-04 ENCOUNTER — Other Ambulatory Visit: Payer: Self-pay | Admitting: Pharmacist

## 2020-04-04 DIAGNOSIS — Z191 Hormone sensitive malignancy status: Secondary | ICD-10-CM

## 2020-04-04 DIAGNOSIS — C61 Malignant neoplasm of prostate: Secondary | ICD-10-CM

## 2020-04-10 ENCOUNTER — Other Ambulatory Visit: Payer: Self-pay | Admitting: Oncology

## 2020-04-10 DIAGNOSIS — C61 Malignant neoplasm of prostate: Secondary | ICD-10-CM

## 2020-04-10 DIAGNOSIS — Z191 Hormone sensitive malignancy status: Secondary | ICD-10-CM

## 2020-04-10 MED FILL — ABIRATERONE ACETATE 250 MG: 250 | 30 days supply | Qty: 120 | Fill #0

## 2020-04-19 ENCOUNTER — Inpatient Hospital Stay
Admission: EM | Admit: 2020-04-19 | Discharge: 2020-04-22 | DRG: 178 | Disposition: A | Discharge status: Home / Self Care | Payer: Medicare Other | Attending: Internal Medicine | Admitting: Internal Medicine

## 2020-04-19 ENCOUNTER — Emergency Department: Payer: Medicare Other

## 2020-04-19 ENCOUNTER — Other Ambulatory Visit: Payer: Self-pay

## 2020-04-19 ENCOUNTER — Encounter: Payer: Self-pay | Admitting: Radiology

## 2020-04-19 DIAGNOSIS — G4733 Obstructive sleep apnea (adult) (pediatric): Secondary | ICD-10-CM | POA: Diagnosis present

## 2020-04-19 DIAGNOSIS — I10 Essential (primary) hypertension: Secondary | ICD-10-CM | POA: Diagnosis present

## 2020-04-19 DIAGNOSIS — E861 Hypovolemia: Secondary | ICD-10-CM | POA: Diagnosis present

## 2020-04-19 DIAGNOSIS — Z87891 Personal history of nicotine dependence: Secondary | ICD-10-CM

## 2020-04-19 DIAGNOSIS — E860 Dehydration: Secondary | ICD-10-CM | POA: Diagnosis present

## 2020-04-19 DIAGNOSIS — U071 COVID-19: Secondary | ICD-10-CM | POA: Diagnosis not present

## 2020-04-19 DIAGNOSIS — E785 Hyperlipidemia, unspecified: Secondary | ICD-10-CM | POA: Diagnosis present

## 2020-04-19 DIAGNOSIS — R531 Weakness: Secondary | ICD-10-CM

## 2020-04-19 DIAGNOSIS — C61 Malignant neoplasm of prostate: Secondary | ICD-10-CM | POA: Diagnosis present

## 2020-04-19 DIAGNOSIS — C7951 Secondary malignant neoplasm of bone: Secondary | ICD-10-CM

## 2020-04-19 DIAGNOSIS — E871 Hypo-osmolality and hyponatremia: Secondary | ICD-10-CM | POA: Diagnosis present

## 2020-04-19 DIAGNOSIS — Z79899 Other long term (current) drug therapy: Secondary | ICD-10-CM

## 2020-04-19 DIAGNOSIS — M353 Polymyalgia rheumatica: Secondary | ICD-10-CM

## 2020-04-19 DIAGNOSIS — E876 Hypokalemia: Secondary | ICD-10-CM

## 2020-04-19 DIAGNOSIS — D649 Anemia, unspecified: Secondary | ICD-10-CM | POA: Diagnosis present

## 2020-04-19 DIAGNOSIS — A0839 Other viral enteritis: Secondary | ICD-10-CM | POA: Diagnosis present

## 2020-04-19 LAB — BASIC METABOLIC PANEL
Anion gap: 13 (ref 5–15)
Anion gap: 11 (ref 5–15)
BUN: 20 mg/dL (ref 8–23)
BUN: 19 mg/dL (ref 8–23)
CO2: 29 mmol/L (ref 22–32)
CO2: 26 mmol/L (ref 22–32)
Calcium: 8.4 mg/dL — ABNORMAL LOW (ref 8.9–10.3)
Calcium: 8.1 mg/dL — ABNORMAL LOW (ref 8.9–10.3)
Chloride: 86 mmol/L — ABNORMAL LOW (ref 98–111)
Chloride: 90 mmol/L — ABNORMAL LOW (ref 98–111)
Creatinine, Ser: 1.13 mg/dL (ref 0.61–1.24)
Creatinine, Ser: 1.03 mg/dL (ref 0.61–1.24)
GFR, Estimated: >60 mL/min (ref >60)
GFR, Estimated: >60 mL/min (ref >60)
Glucose, Bld: 128 mg/dL — ABNORMAL HIGH (ref 70–99)
Glucose, Bld: 128 mg/dL — ABNORMAL HIGH (ref 70–99)
Potassium: 2.1 mmol/L — CL (ref 3.5–5.1)
Potassium: 2.6 mmol/L — CL (ref 3.5–5.1)
Sodium: 128 mmol/L — ABNORMAL LOW (ref 135–145)
Sodium: 127 mmol/L — ABNORMAL LOW (ref 135–145)

## 2020-04-19 LAB — SARS CORONAVIRUS 2 BY RT PCR (HOSPITAL ORDER, PERFORMED IN ~~LOC~~ HOSPITAL LAB): SARS Coronavirus 2: POSITIVE — AB

## 2020-04-19 LAB — CBC
HCT: 25.7 % — ABNORMAL LOW (ref 39.0–52.0)
Hemoglobin: 9.3 g/dL — ABNORMAL LOW (ref 13.0–17.0)
MCH: 31.3 pg (ref 26.0–34.0)
MCHC: 36.2 g/dL — ABNORMAL HIGH (ref 30.0–36.0)
MCV: 86.5 fL (ref 80.0–100.0)
Platelets: 129 10*3/uL — ABNORMAL LOW (ref 150–400)
RBC: 2.97 MIL/uL — ABNORMAL LOW (ref 4.22–5.81)
RDW: 13.7 % (ref 11.5–15.5)
WBC: 7.8 10*3/uL (ref 4.0–10.5)
nRBC: 0 % (ref 0.0–0.2)

## 2020-04-19 LAB — LACTIC ACID, PLASMA: Lactic Acid, Venous: 1.7 mmol/L (ref 0.5–1.9)

## 2020-04-19 LAB — MAGNESIUM: Magnesium: 2.2 mg/dL (ref 1.7–2.4)

## 2020-04-19 MED ORDER — POTASSIUM CHLORIDE 10 MEQ/100ML IV SOLN
10.0000 meq | INTRAVENOUS | Status: AC
  Administered 2020-04-20 (×3): 10 meq via INTRAVENOUS
  Filled 2020-04-19 (×2): qty 100

## 2020-04-19 MED ORDER — LACTATED RINGERS IV BOLUS
1000.0000 mL | Freq: Once | INTRAVENOUS | Status: AC
  Administered 2020-04-19: 1000 mL via INTRAVENOUS

## 2020-04-19 MED ORDER — SODIUM CHLORIDE 0.9 % IV SOLN: INTRAVENOUS | Status: DC

## 2020-04-19 MED ORDER — ONDANSETRON HCL 4 MG/2ML IJ SOLN: 4.0000 mg | Freq: Four times a day (QID) | INTRAMUSCULAR | Status: DC | PRN

## 2020-04-19 MED ORDER — SODIUM CHLORIDE 0.9 % IV SOLN: 100.0000 mg | Freq: Every day | INTRAVENOUS | Status: DC

## 2020-04-19 MED ORDER — POTASSIUM CHLORIDE 10 MEQ/100ML IV SOLN
10.0000 meq | INTRAVENOUS | Status: AC
Start: 2020-04-19 — End: 2020-04-19
  Administered 2020-04-19 (×3): 10 meq via INTRAVENOUS
  Filled 2020-04-19 (×3): qty 100

## 2020-04-19 MED ORDER — SODIUM CHLORIDE 0.9 % IV SOLN
200.0000 mg | Freq: Once | INTRAVENOUS | Status: DC
  Filled 2020-04-19: qty 40

## 2020-04-19 MED ORDER — ENOXAPARIN SODIUM 40 MG/0.4ML ~~LOC~~ SOLN
40.0000 mg | SUBCUTANEOUS | Status: DC
  Filled 2020-04-19 (×2): qty 0.4

## 2020-04-19 MED ORDER — POTASSIUM CHLORIDE CRYS ER 20 MEQ PO TBCR
40.0000 meq | EXTENDED_RELEASE_TABLET | Freq: Once | ORAL | Status: AC
  Administered 2020-04-19: 40 meq via ORAL
  Filled 2020-04-19: qty 2

## 2020-04-19 NOTE — H&P (Signed)
History and Physical    Alexis Cruz [id number] DOB: [birth year] DOA: 04/19/2020  PCP: [name], MD  Patient coming from: Home  I have personally briefly reviewed patient's old medical records in [place]  Chief Complaint: Worsening weakness  HPI: Alexis Cruz is a 85 y.o. male with medical history significant for prostate cancer with metastasis to bone, hypertension, OSA not on CPAP, polymyalgia rheumatica, and hyperlipidemia who presents with worsening weakness, nausea vomiting and diarrhea.  Patient has been having ongoing weakness and fatigue with his immunotherapy/chemotherapy for prostate cancer.  However this has been getting progressively worse and he can barely ambulate across a room.  Also has been having on and off diarrhea for the past month and this past week began to have nausea and vomiting.  He has been able to keep some fluids and food down.  Denies any abdominal pain.  No fever.  Him and his wife tested positive for Covid about a week ago outpatient. Did not get Sars-Covid vaccine.  He denies any chest pain or shortness of breath.  ED Course: Patient has stable normal vitals and was noted to be hyponatremic and hypokalemic.  He was given 1 L of LR IV fluids, 10 mEq IV potassium, oral 40 mEq of oral potassium.  Review of Systems:  Constitutional: No Weight Change, No Fever ENT/Mouth: No sore throat, No Rhinorrhea Eyes: No Eye Pain, No Vision Changes Cardiovascular: No Chest Pain, no SOB Respiratory: + Cough, No Sputum, No Wheezing, no Dyspnea  Gastrointestinal: + Nausea, + Vomiting, + Diarrhea, No Constipation, No Pain Genitourinary: no Urinary Incontinence, No Urgency, No Flank Pain Musculoskeletal: No Arthralgias, No Myalgias Skin: No Skin Lesions, No Pruritus, Neuro: no Weakness, No Numbness,  No Loss of Consciousness, No Syncope Psych: No Anxiety/Panic, No Depression, + decrease appetite Heme/Lymph: No Bruising, No Bleeding   Past  Medical History:  Diagnosis Date  . Hypertension   . Prostate cancer ([place])     No past surgical history on file.   reports that he has quit smoking. His smoking use included pipe. He has never used smokeless tobacco. He reports previous alcohol use. He reports that he does not use drugs. Social History  Allergies  Allergen Reactions  . Citalopram Other (See Comments)    Unsure but family remembers that he could not take the dru-not sure what happened    No family history on file.   Prior to Admission medications   Medication Sig Start Date End Date Taking? Authorizing Provider  abiraterone acetate (ZYTIGA) 250 MG tablet TAKE 4 TABLETS (1,000 MG TOTAL) BY MOUTH DAILY. TAKE ON AN EMPTY STOMACH 1 HOUR BEFORE OR 2 HOURS AFTER A MEAL 04/10/20   Sindy Guadeloupe, MD  Cholecalciferol 125 MCG (5000 UT) capsule Take 5,000 Units by mouth once a week. 08/07/18 08/07/22  [provider]  clonazePAM (KLONOPIN) 0.5 MG tablet Take 1 tablet (0.5 mg total) by mouth 2 (two) times daily as needed. 02/04/20   Sindy Guadeloupe, MD  ibuprofen (ADVIL) 200 MG tablet Take 200 mg by mouth 2 (two) times daily as needed.    [provider]  lisinopril-hydrochlorothiazide (ZESTORETIC) 20-12.5 MG tablet Take 1 tablet by mouth once daily 03/17/20   Sindy Guadeloupe, MD  ondansetron (ZOFRAN) 4 MG tablet Take 1 tablet (4 mg total) by mouth every 6 (six) hours as needed for nausea or vomiting. 01/18/20   Sindy Guadeloupe, MD  oxyCODONE (OXY IR/ROXICODONE) 5 MG immediate release tablet Take 1  tablet (5 mg total) by mouth every 6 (six) hours as needed for severe pain. Patient not taking: Reported on 02/04/2020 11/16/19   Sindy Guadeloupe, MD  predniSONE (DELTASONE) 5 MG tablet Take 1 tablet (5 mg total) by mouth daily with breakfast. 01/03/20   Sindy Guadeloupe, MD  Zinc 50 MG CAPS Take 1 capsule by mouth daily. Patient not taking: Reported on 01/03/2020    [provider]    Physical Exam: Vitals:    04/19/20 1641 04/19/20 1834  BP: (!) 104/48 103/70  Pulse: 74 84  Resp: 18 (!) 28  Temp: 99 F (37.2 C)   TempSrc: Oral   SpO2: 93% 95%    Constitutional: NAD, calm, comfortable, mildly ill-appearing elderly gentleman lying flat in bed Vitals:   04/19/20 1641 04/19/20 1834  BP: (!) 104/48 103/70  Pulse: 74 84  Resp: 18 (!) 28  Temp: 99 F (37.2 C)   TempSrc: Oral   SpO2: 93% 95%   Eyes: PERRL, lids and conjunctivae normal ENMT: Mucous membranes are moist. Neck: normal, supple, no masses, no thyromegaly Respiratory: clear to auscultation bilaterally, no wheezing, no crackles.  Slightly labored respiration when talking.  No accessory muscle use.  Cardiovascular: Regular rate and rhythm, no murmurs / rubs / gallops. No extremity edema.   Abdomen: no tenderness, no masses palpated.  Bowel sounds positive.  Musculoskeletal: no clubbing / cyanosis. No joint deformity upper and lower extremities. Good ROM, no contractures. Normal muscle tone.  Skin: no rashes, lesions, ulcers. No induration Neurologic: CN 2-12 grossly intact. Sensation intact, Strength 5/5 in all 4.  Psychiatric: Normal judgment and insight. Alert and oriented x 3. Normal mood.     Labs on Admission: I have personally reviewed following labs and imaging studies  CBC: Recent Labs  Lab 04/19/20 1741  WBC 7.8  HGB 9.3*  HCT 25.7*  MCV 86.5  PLT 509*   Basic Metabolic Panel: Recent Labs  Lab 04/19/20 1741 04/19/20 1806  NA 128*  --   K 2.1*  --   CL 86*  --   CO2 29  --   GLUCOSE 128*  --   BUN 20  --   CREATININE 1.13  --   CALCIUM 8.4*  --   MG  --  2.2   GFR: CrCl cannot be calculated (Unknown ideal weight.). Liver Function Tests: No results for input(s): AST, ALT, ALKPHOS, BILITOT, PROT, ALBUMIN in the last 168 hours. No results for input(s): LIPASE, AMYLASE in the last 168 hours. No results for input(s): AMMONIA in the last 168 hours. Coagulation Profile: No results for input(s): INR,  PROTIME in the last 168 hours. Cardiac Enzymes: No results for input(s): CKTOTAL, CKMB, CKMBINDEX, TROPONINI in the last 168 hours. BNP (last 3 results) No results for input(s): PROBNP in the last 8760 hours. HbA1C: No results for input(s): HGBA1C in the last 72 hours. CBG: No results for input(s): GLUCAP in the last 168 hours. Lipid Profile: No results for input(s): CHOL, HDL, LDLCALC, TRIG, CHOLHDL, LDLDIRECT in the last 72 hours. Thyroid Function Tests: No results for input(s): TSH, T4TOTAL, FREET4, T3FREE, THYROIDAB in the last 72 hours. Anemia Panel: No results for input(s): VITAMINB12, FOLATE, FERRITIN, TIBC, IRON, RETICCTPCT in the last 72 hours. Urine analysis: No results found for: COLORURINE, APPEARANCEUR, [place], GLUCOSEU, HGBUR, BILIRUBINUR, KETONESUR, PROTEINUR, UROBILINOGEN, NITRITE, LEUKOCYTESUR  Radiological Exams on Admission: DG Chest Portable 1 View  Result Date: 04/19/2020 CLINICAL DATA:  Weakness.  COVID positive 6 days  ago. EXAM: PORTABLE CHEST 1 VIEW COMPARISON:  PET CT 11/15/2019.  Remote chest CT 07/15/2007 FINDINGS: Mild hyperinflation and bronchial thickening. Ill-defined opacity in the left greater than right lung base. Heart is normal in size. Normal mediastinal contours with aortic atherosclerosis. No evidence of pulmonary edema. No pleural fluid. No pneumothorax. No acute osseous abnormalities are seen. Patient with known osseous metastatic disease that is not well demonstrated by radiograph. IMPRESSION: 1. Ill-defined bibasilar opacity, left greater than right, suspicious for pneumonia in the setting of COVID 19. 2. Mild hyperinflation and bronchial thickening, can be seen with asthma, bronchitis, or smoking related lung disease. Electronically Signed   By: Keith Rake M.D.   On: 04/19/2020 19:24      Assessment/Plan  COVID-19 infection Patient with symptoms of nausea, vomiting and diarrhea.  Most likely secondary to Covid infection but  given his immunocompromise state with cancer we will also check GI panel Given patient is high risk, he does qualify for 3 days of IV remdesivir.  Benefit and risk were discussed with patient and declined after discussion with his wife  Hyponatremia Secondary to hypovolemia from GI symptoms Repeat BMP while getting continuous IV fluids at 70 cc/hr  Hypokalemia Repleted with 10 mEq of IV potassium and 40 mEq of oral potassium Will repeat BMP in a few hours and replete with more if needed  Prostate cancer with metastasis to bone Continue home regimen of chemotherapy and steroid Follows with oncologist Dr. Janese Banks  Hypertension Continue home regimen of Lisinopril-HCTZ  Polymyalgia rheumatica Continue home regimen of prednisone daily  OSA Not on CPAP  DVT prophylaxis:.Lovenox Code Status: Full Family Communication: Plan discussed with patient at bedside  disposition Plan: Home with observation Consults called:  Admission status: Observation  Level of care: Med-Surg  Status is: Observation  The patient remains OBS appropriate and will d/c before 2 midnights.  Dispo: The patient is from: Home              Anticipated d/c is to: Home              Anticipated d/c date is: 1 day              Patient currently is not medically stable to d/c.   Difficult to place patient No         Orene Desanctis DO [facility]   If 7PM-7AM, please contact night-coverage [email or web address]   04/19/2020, 7:54 PM

## 2020-04-19 NOTE — ED Provider Notes (Addendum)
[facility] Emergency Department Provider Note  ____________________________________________   Event Date/Time   First MD Initiated Contact with Patient 04/19/20 1758     (approximate)  I have reviewed the triage vital signs and the nursing notes.   HISTORY  Chief Complaint Weakness    HPI Alexis Cruz is a 85 y.o. male with history of prostate cancer, hypertension, here with generalized weakness.  The patient states that he was recently diagnosed with Covid.  Of last week, he has had progressive worsening generalized weakness, nausea, and poor appetite.  He has not been eating and drinking much.  He states that over the last several days, he has had increasing weakness and is now having difficulty even getting around the house.  He states that symptoms have been fairly constant since he was started on oral immune O/chemotherapy regimen, but symptoms are significantly worse since he was diagnosed with Covid.  Denies any shortness of breath or cough.  He had difficulty even getting out of bed today which is why presents for further evaluation.  Does not recall any fevers.  Denies any pain.        Past Medical History:  Diagnosis Date  . Hypertension   . Prostate cancer [facility])     Patient Active Problem List   Diagnosis Date Noted  . Hypokalemia 04/19/2020  . Goals of care, counseling/discussion 12/06/2019  . Bone metastases ([place]) 12/06/2019  . Prostate cancer metastatic to bone ([place]) 12/06/2019  . 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 Anxiety 11/16/2019  . ED (erectile dysfunction) 11/16/2019  . Polymyalgia rheumatica ([place]) 01/24/2018  . Dupuytren's contracture of hand 08/16/2017  . OSA on CPAP 07/23/2016  . Immunization refused 03/08/2016  . Dyslipidemia 03/27/2014  . Encounter for long-term (current) use of medications 03/27/2014  . Benign essential hypertension 08/21/2013  . Allergic rhinitis  02/12/2013  . Impaired fasting glucose 02/12/2013  . Tobacco use disorder 09/28/2012  . AK (actinic keratosis) 12/24/2011  . Nummular eczema 12/24/2011  . Carotid bruit 09/06/2011    No past surgical history on file.  Prior to Admission medications   Medication Sig Start Date End Date Taking? Authorizing Provider  abiraterone acetate (ZYTIGA) 250 MG tablet TAKE 4 TABLETS (1,000 MG TOTAL) BY MOUTH DAILY. TAKE ON AN EMPTY STOMACH 1 HOUR BEFORE OR 2 HOURS AFTER A MEAL 04/10/20   Sindy Guadeloupe, MD  Cholecalciferol 125 MCG (5000 UT) capsule Take 5,000 Units by mouth once a week. 08/07/18 08/07/22  [provider]  clonazePAM (KLONOPIN) 0.5 MG tablet Take 1 tablet (0.5 mg total) by mouth 2 (two) times daily as needed. 02/04/20   Sindy Guadeloupe, MD  ibuprofen (ADVIL) 200 MG tablet Take 200 mg by mouth 2 (two) times daily as needed.    [provider]  lisinopril-hydrochlorothiazide (ZESTORETIC) 20-12.5 MG tablet Take 1 tablet by mouth once daily 03/17/20   Sindy Guadeloupe, MD  ondansetron (ZOFRAN) 4 MG tablet Take 1 tablet (4 mg total) by mouth every 6 (six) hours as needed for nausea or vomiting. 01/18/20   Sindy Guadeloupe, MD  oxyCODONE (OXY IR/ROXICODONE) 5 MG immediate release tablet Take 1 tablet (5 mg total) by mouth every 6 (six) hours as needed for severe pain. Patient not taking: Reported on 02/04/2020 11/16/19   Sindy Guadeloupe, MD  predniSONE (DELTASONE) 5 MG tablet Take 1 tablet (5 mg total) by mouth daily with breakfast. 01/03/20   Sindy Guadeloupe, MD  Zinc  50 MG CAPS Take 1 capsule by mouth daily. Patient not taking: Reported on 01/03/2020    [provider]    Allergies Citalopram  No family history on file.  Social History Social History   Tobacco Use  . Smoking status: Former Smoker    Types: Pipe  . Smokeless tobacco: Never Used  . Tobacco comment: no pipes in over a month  Vaping Use  . Vaping Use: Never used  Substance Use Topics  . Alcohol  use: Not Currently  . Drug use: Never    Review of Systems  Review of Systems  Constitutional: Positive for fatigue. Negative for chills and fever.  HENT: Negative for sore throat.   Respiratory: Negative for shortness of breath.   Cardiovascular: Negative for chest pain.  Gastrointestinal: Positive for diarrhea, nausea and vomiting. Negative for abdominal pain.  Genitourinary: Negative for flank pain.  Musculoskeletal: Negative for neck pain.  Skin: Negative for rash and wound.  Allergic/Immunologic: Negative for immunocompromised state.  Neurological: Positive for weakness. Negative for numbness.  Hematological: Does not bruise/bleed easily.  All other systems reviewed and are negative.    ____________________________________________  PHYSICAL EXAM:      VITAL SIGNS: ED Triage Vitals  Enc Vitals Group     BP 04/19/20 1641 (!) 104/48     Pulse Rate 04/19/20 1641 74     Resp 04/19/20 1641 18     Temp 04/19/20 1641 99 F (37.2 C)     Temp Source 04/19/20 1641 Oral     SpO2 04/19/20 1641 93 %     Weight --      Height --      Head Circumference --      Peak Flow --      Pain Score 04/19/20 1642 0     Pain Loc --      Pain Edu? --      Excl. in [place]? --      Physical Exam Vitals and nursing note reviewed.  Constitutional:      General: He is not in acute distress.    Appearance: He is well-developed.  HENT:     Head: Normocephalic and atraumatic.     Mouth/Throat:     Mouth: Mucous membranes are dry.  Eyes:     Conjunctiva/sclera: Conjunctivae normal.  Cardiovascular:     Rate and Rhythm: Normal rate and regular rhythm.     Heart sounds: Normal heart sounds. No murmur heard. No friction rub.  Pulmonary:     Effort: Pulmonary effort is normal. No respiratory distress.     Breath sounds: Normal breath sounds. No wheezing or rales.  Abdominal:     General: There is no distension.     Palpations: Abdomen is soft.     Tenderness: There is no abdominal  tenderness.  Musculoskeletal:     Cervical back: Neck supple.  Skin:    General: Skin is warm.     Capillary Refill: Capillary refill takes less than 2 seconds.  Neurological:     Mental Status: He is alert and oriented to person, place, and time.     Motor: No abnormal muscle tone.       ____________________________________________   LABS (all labs ordered are listed, but only abnormal results are displayed)  Labs Reviewed  BASIC METABOLIC PANEL - Abnormal; Notable for the following components:      Result Value   Sodium 128 (*)    Potassium 2.1 (*)    Chloride  86 (*)    Glucose, Bld 128 (*)    Calcium 8.4 (*)    All other components within normal limits  CBC - Abnormal; Notable for the following components:   RBC 2.97 (*)    Hemoglobin 9.3 (*)    HCT 25.7 (*)    MCHC 36.2 (*)    Platelets 129 (*)    All other components within normal limits  GASTROINTESTINAL PANEL BY PCR, STOOL (REPLACES STOOL CULTURE)  LACTIC ACID, PLASMA  MAGNESIUM  URINALYSIS, COMPLETE (UACMP) WITH MICROSCOPIC  LACTIC ACID, PLASMA  BASIC METABOLIC PANEL  BASIC METABOLIC PANEL  CBC  CBG MONITORING, ED    ____________________________________________  EKG: Normal sinus rhythm, ventricular rate 73.  PR 134, QRS 76, QTc 460.  No acute ST elevation or depression.  No EKG evidence of acute ischemia or infarct. ________________________________________  RADIOLOGY All imaging, including plain films, CT scans, and ultrasounds, independently reviewed by me, and interpretations confirmed via formal radiology reads.  ED MD interpretation:   Chest x-ray: Bilateral PNA c/w COVID-19  Official radiology report(s): DG Chest Portable 1 View  Result Date: 04/19/2020 CLINICAL DATA:  Weakness.  COVID positive 6 days ago. EXAM: PORTABLE CHEST 1 VIEW COMPARISON:  PET CT 11/15/2019.  Remote chest CT 07/15/2007 FINDINGS: Mild hyperinflation and bronchial thickening. Ill-defined opacity in the left greater than  right lung base. Heart is normal in size. Normal mediastinal contours with aortic atherosclerosis. No evidence of pulmonary edema. No pleural fluid. No pneumothorax. No acute osseous abnormalities are seen. Patient with known osseous metastatic disease that is not well demonstrated by radiograph. IMPRESSION: 1. Ill-defined bibasilar opacity, left greater than right, suspicious for pneumonia in the setting of COVID 19. 2. Mild hyperinflation and bronchial thickening, can be seen with asthma, bronchitis, or smoking related lung disease. Electronically Signed   By: Keith Rake M.D.   On: 04/19/2020 19:24    ____________________________________________  PROCEDURES   Procedure(s) performed (including Critical Care):  .1-3 Lead EKG Interpretation Performed by: Duffy Bruce, MD Authorized by: Duffy Bruce, MD     Interpretation: normal     ECG rate:  80-90   ECG rate assessment: normal     Rhythm: sinus rhythm     Ectopy: none     Conduction: normal   Comments:     Indication: Hypokalemia, weakness    ____________________________________________  INITIAL IMPRESSION / MDM / [place] / ED COURSE  As part of my medical decision making, I reviewed the following data within the [facility] notes reviewed and incorporated, Old chart reviewed, Notes from prior ED visits, and [place] Controlled Substance Database       *Jansiel Wilke was evaluated in Emergency Department on 04/19/2020 for the symptoms described in the history of present illness. He was evaluated in the context of the global COVID-19 pandemic, which necessitated consideration that the patient might be at risk for infection with the SARS-CoV-2 virus that causes COVID-19. Institutional protocols and algorithms that pertain to the evaluation of patients at risk for COVID-19 are in a state of rapid change based on information released by regulatory bodies including the CDC and federal and state  organizations. These policies and algorithms were followed during the patient's care in the ED.  Some ED evaluations and interventions may be delayed as a result of limited staffing during the pandemic.*     Medical Decision Making: 85 year old male here with generalized weakness.  Suspect multifactorial weakness secondary to dehydration in the  setting of patient's chemo/immunotherapy as well as recent Covid diagnosis.  Regarding his Covid, he has no shortness of breath, hypoxia, patient appears to be doing well.  Lab work shows marked hypokalemia, and chronic anemia.  Lactic acid normal.  Mag normal.  Will start IV and oral potassium replacement and admit.  Patient's wife was updated via telephone by myself.  Patient updated and in agreement. CXR does show COVID-19 but pt not hypoxic, and is o/w well appearing.  ____________________________________________  FINAL CLINICAL IMPRESSION(S) / ED DIAGNOSES  Final diagnoses:  Hypokalemia  Generalized weakness  COVID-19     MEDICATIONS GIVEN DURING THIS VISIT:  Medications  potassium chloride 10 mEq in 100 mL IVPB (0 mEq Intravenous Stopped 04/19/20 1932)  enoxaparin (LOVENOX) injection 40 mg (has no administration in time range)  potassium chloride SA (KLOR-CON) CR tablet 40 mEq (40 mEq Oral Given 04/19/20 1828)  lactated ringers bolus 1,000 mL (1,000 mLs Intravenous New Bag/Given 04/19/20 1825)     ED Discharge Orders    None       Note:  This document was prepared using Dragon voice recognition software and may include unintentional dictation errors.   Duffy Bruce, MD 04/19/20 Lenny Pastel    Duffy Bruce, MD 04/19/20 [phone]

## 2020-04-19 NOTE — ED Triage Notes (Signed)
Pt presents via POV c/o N/V/D intermittently. Reports tested positive for Covid on Monday. Pt unable to tolerate PO intake per wife. Hx cancer per wife. Also reports dry mouth, fatigue, and weakness.

## 2020-04-19 NOTE — ED Notes (Signed)
Date and time results received: 04/19/20 1801   Test: potassium  Critical Value: 2.1  Name of Provider Notified: Dr. Ellender Hose

## 2020-04-19 NOTE — Consult Note (Signed)
Remdesivir - Pharmacy Brief Note   O:  ALT: ordered CXR: Ill-defined bibasilar opacity, left greater than right, suspicious for pneumonia in the setting of COVID 19. SpO2: 95% on RA   A/P:  85 y/o male presents with ongoing weakness and fatigue with his immunotherapy/chemotherapy for prostate cancer. Also has been having on and off diarrhea for the past month and this past week began to have nausea and vomiting. He denies any chest pain or shortness of breath.  Remdesivir 200 mg IVPB once followed by 100 mg IVPB daily x 2 days to complete 3 day course. May extend up to 5 days if symptoms progress.   Dorothe Pea, PharmD, BCPS Clinical Pharmacist  04/19/2020 10:07 PM

## 2020-04-19 NOTE — ED Notes (Addendum)
Pt states he tested positive for COVID 2 Monday's ago. C/o nausea, vomiting this AM, diarrhea multiple times a day, weakness, and SOB. Denies fever. A&O, ambulatory around room upon this RN entering room. On room air. Pt states he takes pills to treat his CA that make him nauseous. No distress noted.

## 2020-04-20 ENCOUNTER — Encounter: Payer: Self-pay | Admitting: Internal Medicine

## 2020-04-20 ENCOUNTER — Other Ambulatory Visit: Payer: Self-pay

## 2020-04-20 DIAGNOSIS — E871 Hypo-osmolality and hyponatremia: Secondary | ICD-10-CM | POA: Diagnosis present

## 2020-04-20 DIAGNOSIS — A0839 Other viral enteritis: Secondary | ICD-10-CM | POA: Diagnosis present

## 2020-04-20 DIAGNOSIS — C7951 Secondary malignant neoplasm of bone: Secondary | ICD-10-CM | POA: Diagnosis present

## 2020-04-20 DIAGNOSIS — E861 Hypovolemia: Secondary | ICD-10-CM | POA: Diagnosis present

## 2020-04-20 DIAGNOSIS — I10 Essential (primary) hypertension: Secondary | ICD-10-CM | POA: Diagnosis present

## 2020-04-20 DIAGNOSIS — Z87891 Personal history of nicotine dependence: Secondary | ICD-10-CM | POA: Diagnosis not present

## 2020-04-20 DIAGNOSIS — E876 Hypokalemia: Secondary | ICD-10-CM

## 2020-04-20 DIAGNOSIS — E785 Hyperlipidemia, unspecified: Secondary | ICD-10-CM | POA: Diagnosis present

## 2020-04-20 DIAGNOSIS — E860 Dehydration: Secondary | ICD-10-CM | POA: Diagnosis present

## 2020-04-20 DIAGNOSIS — C61 Malignant neoplasm of prostate: Secondary | ICD-10-CM | POA: Diagnosis present

## 2020-04-20 DIAGNOSIS — D649 Anemia, unspecified: Secondary | ICD-10-CM | POA: Diagnosis present

## 2020-04-20 DIAGNOSIS — U071 COVID-19: Secondary | ICD-10-CM | POA: Diagnosis present

## 2020-04-20 DIAGNOSIS — Z79899 Other long term (current) drug therapy: Secondary | ICD-10-CM | POA: Diagnosis not present

## 2020-04-20 DIAGNOSIS — M353 Polymyalgia rheumatica: Secondary | ICD-10-CM | POA: Diagnosis present

## 2020-04-20 DIAGNOSIS — G4733 Obstructive sleep apnea (adult) (pediatric): Secondary | ICD-10-CM | POA: Diagnosis present

## 2020-04-20 LAB — COMPREHENSIVE METABOLIC PANEL
ALT: 22 U/L (ref 0–44)
AST: 37 U/L (ref 15–41)
Albumin: 3.1 g/dL — ABNORMAL LOW (ref 3.5–5.0)
Alkaline Phosphatase: 46 U/L (ref 38–126)
Anion gap: 11 (ref 5–15)
BUN: 16 mg/dL (ref 8–23)
CO2: 24 mmol/L (ref 22–32)
Calcium: 8.3 mg/dL — ABNORMAL LOW (ref 8.9–10.3)
Chloride: 96 mmol/L — ABNORMAL LOW (ref 98–111)
Creatinine, Ser: 1.05 mg/dL (ref 0.61–1.24)
GFR, Estimated: >60 mL/min (ref >60)
Glucose, Bld: 102 mg/dL — ABNORMAL HIGH (ref 70–99)
Potassium: 3 mmol/L — ABNORMAL LOW (ref 3.5–5.1)
Sodium: 131 mmol/L — ABNORMAL LOW (ref 135–145)
Total Bilirubin: 1 mg/dL (ref 0.3–1.2)
Total Protein: 6 g/dL — ABNORMAL LOW (ref 6.5–8.1)

## 2020-04-20 LAB — CBC
HCT: 23.9 % — ABNORMAL LOW (ref 39.0–52.0)
Hemoglobin: 8.6 g/dL — ABNORMAL LOW (ref 13.0–17.0)
MCH: 31.6 pg (ref 26.0–34.0)
MCHC: 36 g/dL (ref 30.0–36.0)
MCV: 87.9 fL (ref 80.0–100.0)
Platelets: 126 10*3/uL — ABNORMAL LOW (ref 150–400)
RBC: 2.72 MIL/uL — ABNORMAL LOW (ref 4.22–5.81)
RDW: 14 % (ref 11.5–15.5)
WBC: 4.9 10*3/uL (ref 4.0–10.5)
nRBC: 0 % (ref 0.0–0.2)

## 2020-04-20 MED ORDER — PREDNISONE 10 MG PO TABS
5.0000 mg | ORAL_TABLET | Freq: Every day | ORAL | Status: DC
  Administered 2020-04-20 – 2020-04-22 (×3): 5 mg via ORAL
  Filled 2020-04-20 (×3): qty 1

## 2020-04-20 MED ORDER — ONDANSETRON HCL 4 MG PO TABS: 4.0000 mg | ORAL_TABLET | Freq: Four times a day (QID) | ORAL | Status: DC | PRN

## 2020-04-20 MED ORDER — HYDROCHLOROTHIAZIDE 12.5 MG PO CAPS: 12.5000 mg | ORAL_CAPSULE | Freq: Every day | ORAL | Status: DC

## 2020-04-20 MED ORDER — ABIRATERONE ACETATE 250 MG PO TABS: 1000.0000 mg | ORAL_TABLET | Freq: Every day | ORAL | Status: DC

## 2020-04-20 MED ORDER — CLONAZEPAM 0.5 MG PO TABS: 0.5000 mg | ORAL_TABLET | Freq: Two times a day (BID) | ORAL | Status: DC | PRN

## 2020-04-20 MED ORDER — LISINOPRIL 10 MG PO TABS: 20.0000 mg | ORAL_TABLET | Freq: Every day | ORAL | Status: DC

## 2020-04-20 MED ORDER — SODIUM CHLORIDE 0.9 % IV SOLN: INTRAVENOUS | Status: DC

## 2020-04-20 MED ORDER — VITAMIN D3 25 MCG (1000 UNIT) PO TABS
5000.0000 [IU] | ORAL_TABLET | ORAL | Status: DC
  Administered 2020-04-20: 5000 [IU] via ORAL
  Filled 2020-04-20 (×2): qty 5

## 2020-04-20 MED ORDER — LISINOPRIL-HYDROCHLOROTHIAZIDE 20-12.5 MG PO TABS: 1.0000 | ORAL_TABLET | Freq: Every day | ORAL | Status: DC

## 2020-04-20 NOTE — Progress Notes (Signed)
Alexis Cruz  [id number] DOB: 06-Jan-1936 DOA: 04/19/2020 PCP: [name], MD    Brief Narrative:  85 year old with a history of prostate cancer metastatic to bone, HTN, OSA not on CPAP, PMR, and HLD who presented to the [facility] ER with nausea, vomiting, diarrhea, and progressive severe weakness.  He is actively undergoing immunotherapy/chemotherapy for his prostate cancer.  He was known to have tested positive for Covid in the outpatient setting approximately 1 week prior to this presentation.  He is not vaccinated.  Significant Events:  1/29 admit via [facility] ED  Date of Positive COVID Test:  04/19/20 [facility] ED)  Vaccination Status: Unvaccinated  COVID-19 specific Treatment: None presently   Antimicrobials:  None  DVT prophylaxis: Lovenox  Subjective: Feels better, but states he is still weak. Denies cp. Poor appetite. No SOB or cough.   Assessment & Plan:  Covid gastroenteritis Continue supportive care - monitor for development of resp sx  Hyponatremia Due to dehydration - follow w/ volume expansion  Severe Hypokalemia Due to GI losses and poor intake - supplement and follow  Prostate cancer metastatic to bone  Normocytic anemia   HTN BP well controlled at present   PMR  OSA Does not use CPAP at home   Code Status: FULL CODE Family Communication:  Status is: Inpatient  Remains inpatient appropriate because:Inpatient level of care appropriate due to severity of illness   Dispo: The patient is from: Home              Anticipated d/c is to: Home              Anticipated d/c date is: 2 days              Patient currently is not medically stable to d/c.   Difficult to place patient No  Consultants:  none  Objective: Blood pressure (!) 96/42, pulse 69, temperature 99 F (37.2 C), temperature source Oral, resp. rate 16, SpO2 97 %.  Intake/Output Summary (Last 24 hours) at 04/20/2020 1013 Last data filed at 04/20/2020 0543 Gross per 24 hour   Intake 2104.72 ml  Output -  Net 2104.72 ml   There were no vitals filed for this visit.  Examination: General: No acute respiratory distress Lungs: Clear to auscultation bilaterally without wheezes or crackles Cardiovascular: Regular rate and rhythm without murmur gallop or rub normal S1 and S2 Abdomen: Nontender, nondistended, soft, bowel sounds positive, no rebound, no ascites, no appreciable mass Extremities: No significant cyanosis, clubbing, or edema bilateral lower extremities  CBC: Recent Labs  Lab 04/19/20 1741 04/20/20 0545  WBC 7.8 4.9  HGB 9.3* 8.6*  HCT 25.7* 23.9*  MCV 86.5 87.9  PLT 129* 536*   Basic Metabolic Panel: Recent Labs  Lab 04/19/20 1741 04/19/20 1806 04/19/20 2200 04/20/20 0545  NA 128*  --  127* 131*  K 2.1*  --  2.6* 3.0*  CL 86*  --  90* 96*  CO2 29  --  26 24  GLUCOSE 128*  --  128* 102*  BUN 20  --  19 16  CREATININE 1.13  --  1.03 1.05  CALCIUM 8.4*  --  8.1* 8.3*  MG  --  2.2  --   --    GFR: CrCl cannot be calculated (Unknown ideal weight.).  Liver Function Tests: Recent Labs  Lab 04/20/20 0545  AST 37  ALT 22  ALKPHOS 46  BILITOT 1.0  PROT 6.0*  ALBUMIN 3.1*    Recent Results (from  the past 240 hour(s))  SARS Coronavirus 2 by RT PCR (hospital order, performed in [facility] hospital lab) Nasopharyngeal Nasopharyngeal Swab     Status: Abnormal   Collection Time: 04/19/20 10:11 PM   Specimen: Nasopharyngeal Swab  Result Value Ref Range Status   SARS Coronavirus 2 POSITIVE (A) NEGATIVE Final    Comment: RESULT CALLED TO, READ BACK BY AND VERIFIED WITH: NOEL WEBSTER RN 04/19/2020 2343 JG (NOTE) SARS-CoV-2 target nucleic acids are DETECTED  SARS-CoV-2 RNA is generally detectable in upper respiratory specimens  during the acute phase of infection.  Positive results are indicative  of the presence of the identified virus, but do not rule out bacterial infection or co-infection with other pathogens not detected by the  test.  Clinical correlation with patient history and  other diagnostic information is necessary to determine patient infection status.  The expected result is negative.  Fact Sheet for Patients:   StrictlyIdeas.no   Fact Sheet for Healthcare Providers:   BankingDealers.co.za    This test is not yet approved or cleared by the [place] FDA and  has been authorized for detection and/or diagnosis of SARS-CoV-2 by FDA under an Emergency Use Authorization (EUA).  This EUA will remain in effect (meaning this tes t can be used) for the duration of  the COVID-19 declaration under Section 564(b)(1) of the Act, 21 U.S.C. section 360-bbb-3(b)(1), unless the authorization is terminated or revoked sooner.  Performed at [facility], [address] [place], [place] [address]      Scheduled Meds: . abiraterone acetate  1,000 mg Oral Daily  . enoxaparin (LOVENOX) injection  40 mg Subcutaneous Q24H  . lisinopril  20 mg Oral Daily   And  . hydrochlorothiazide  12.5 mg Oral Daily  . predniSONE  5 mg Oral Q breakfast     LOS: 0 days   Cherene Altes, MD [facility] Office  [phone] Pager - Text Page per Amion  If 7PM-7AM, please contact night-coverage per Amion 04/20/2020, 10:13 AM

## 2020-04-20 NOTE — Progress Notes (Signed)
Patient refused scheduled pm Lovenox. Patient education done.

## 2020-04-20 NOTE — ED Notes (Addendum)
CRITICAL LAB: COVID is POSITIVE, [facility], Dr. Flossie Buffy notified, delay for notification d/t to this RN being in blood bank

## 2020-04-20 NOTE — ED Notes (Signed)
Advised nurse that patient has assigned bed 

## 2020-04-20 NOTE — ED Notes (Signed)
Pt taken own Zytiga from home

## 2020-04-21 DIAGNOSIS — E876 Hypokalemia: Secondary | ICD-10-CM | POA: Diagnosis not present

## 2020-04-21 LAB — BASIC METABOLIC PANEL
Anion gap: 12 (ref 5–15)
Anion gap: 11 (ref 5–15)
BUN: 12 mg/dL (ref 8–23)
BUN: 12 mg/dL (ref 8–23)
CO2: 24 mmol/L (ref 22–32)
CO2: 25 mmol/L (ref 22–32)
Calcium: 8.5 mg/dL — ABNORMAL LOW (ref 8.9–10.3)
Calcium: 8.6 mg/dL — ABNORMAL LOW (ref 8.9–10.3)
Chloride: 98 mmol/L (ref 98–111)
Chloride: 98 mmol/L (ref 98–111)
Creatinine, Ser: 0.92 mg/dL (ref 0.61–1.24)
Creatinine, Ser: 0.96 mg/dL (ref 0.61–1.24)
GFR, Estimated: >60 mL/min (ref >60)
GFR, Estimated: >60 mL/min (ref >60)
Glucose, Bld: 94 mg/dL (ref 70–99)
Glucose, Bld: 99 mg/dL (ref 70–99)
Potassium: 2.6 mmol/L — CL (ref 3.5–5.1)
Potassium: 3.5 mmol/L (ref 3.5–5.1)
Sodium: 134 mmol/L — ABNORMAL LOW (ref 135–145)
Sodium: 134 mmol/L — ABNORMAL LOW (ref 135–145)

## 2020-04-21 LAB — CBC
HCT: 23.2 % — ABNORMAL LOW (ref 39.0–52.0)
Hemoglobin: 8.6 g/dL — ABNORMAL LOW (ref 13.0–17.0)
MCH: 31.9 pg (ref 26.0–34.0)
MCHC: 37.1 g/dL — ABNORMAL HIGH (ref 30.0–36.0)
MCV: 85.9 fL (ref 80.0–100.0)
Platelets: 146 10*3/uL — ABNORMAL LOW (ref 150–400)
RBC: 2.7 MIL/uL — ABNORMAL LOW (ref 4.22–5.81)
RDW: 14 % (ref 11.5–15.5)
WBC: 4.7 10*3/uL (ref 4.0–10.5)
nRBC: 0 % (ref 0.0–0.2)

## 2020-04-21 LAB — MAGNESIUM: Magnesium: 2 mg/dL (ref 1.7–2.4)

## 2020-04-21 MED ORDER — POTASSIUM CHLORIDE 20 MEQ PO PACK
40.0000 meq | PACK | Freq: Once | ORAL | Status: AC
  Administered 2020-04-21: 40 meq via ORAL
  Filled 2020-04-21: qty 2

## 2020-04-21 MED ORDER — POTASSIUM CHLORIDE CRYS ER 20 MEQ PO TBCR
40.0000 meq | EXTENDED_RELEASE_TABLET | Freq: Three times a day (TID) | ORAL | Status: DC
  Administered 2020-04-21 – 2020-04-22 (×4): 40 meq via ORAL
  Filled 2020-04-21 (×4): qty 2

## 2020-04-21 NOTE — Progress Notes (Deleted)
1 

## 2020-04-21 NOTE — Progress Notes (Signed)
Patient refused scheduled pm lovenox

## 2020-04-21 NOTE — Plan of Care (Signed)
  Problem: Education: Goal: Knowledge of risk factors and measures for prevention of condition will improve Outcome: Progressing   Problem: Coping: Goal: Psychosocial and spiritual needs will be supported Outcome: Progressing   Problem: Respiratory: Goal: Will maintain a patent airway Outcome: Progressing Goal: Complications related to the disease process, condition or treatment will be avoided or minimized Outcome: Progressing   

## 2020-04-21 NOTE — Progress Notes (Signed)
Alexis Cruz  [id number]:[id number] DOB: November 08, 1935 DOA: 04/19/2020 PCP: [name], MD    Brief Narrative:  85 year old with a history of prostate cancer metastatic to bone, HTN, OSA not on CPAP, PMR, and HLD who presented to the [facility] ER with nausea, vomiting, diarrhea, and progressive severe weakness.  He is actively undergoing immunotherapy/chemotherapy for his prostate cancer.  He was known to have tested positive for Covid in the outpatient setting approximately 1 week prior to this presentation.  He is not vaccinated.  Significant Events:  1/29 admit via [facility] ED  Date of Positive COVID Test:  04/19/20 [facility] ED)  Vaccination Status: Unvaccinated  COVID-19 specific Treatment: Refused Remdesivir   Antimicrobials:  None  DVT prophylaxis: Lovenox  Subjective: Afebrile.  Vital signs stable.  Saturations 95-97% on room air. Feeling much better overall. Reports near resolution of his nausea, with a desire to eat a regular diet. Denies any further diarrhea.   Assessment & Plan:  Covid gastroenteritis Continue supportive care - monitor for development of resp sx - appears to be improving from a GI standpoint  Hyponatremia Due to dehydration - improving w/ volume expansion - recheck in AM   Severe Hypokalemia Due to GI losses and poor intake -persisting - increase supplement and follow -magnesium is not low  Prostate cancer metastatic to bone Continue usual outpatient therapy  Normocytic anemia  Likely a consequence of "chronic disease"/prostate cancer  HTN BP well controlled at present   PMR No evidence of an acute flare at present  OSA Does not use CPAP at home   Code Status: FULL CODE Family Communication:  Status is: Inpatient  Remains inpatient appropriate because:Inpatient level of care appropriate due to severity of illness   Dispo: The patient is from: Home              Anticipated d/c is to: Home              Anticipated d/c date is: 1 day               Patient currently is not medically stable to d/c.   Difficult to place patient No  Consultants:  none  Objective: Blood pressure (!) 126/91, pulse 83, temperature 98.7 F (37.1 C), temperature source Oral, resp. rate 16, SpO2 95 %.  Intake/Output Summary (Last 24 hours) at 04/21/2020 0811 Last data filed at 04/21/2020 0604 Gross per 24 hour  Intake 875.21 ml  Output 640 ml  Net 235.21 ml   There were no vitals filed for this visit.  Examination: General: No acute respiratory distress Lungs: Clear to auscultation bilaterally - no wheezing  Cardiovascular: RRR - no M or rub  Abdomen: NT/ND, soft, bs+, no mass  Extremities: No significant edema bilateral lower extremities  CBC: Recent Labs  Lab 04/19/20 1741 04/20/20 0545 04/21/20 0413  WBC 7.8 4.9 4.7  HGB 9.3* 8.6* 8.6*  HCT 25.7* 23.9* 23.2*  MCV 86.5 87.9 85.9  PLT 129* 126* 381*   Basic Metabolic Panel: Recent Labs  Lab 04/19/20 1806 04/19/20 2200 04/20/20 0545 04/21/20 0413  NA  --  127* 131* 134*  K  --  2.6* 3.0* 2.6*  CL  --  90* 96* 98  CO2  --  26 24 24   GLUCOSE  --  128* 102* 94  BUN  --  19 16 12   CREATININE  --  1.03 1.05 0.92  CALCIUM  --  8.1* 8.3* 8.5*  MG 2.2  --   --  2.0   GFR: CrCl cannot be calculated (Unknown ideal weight.).  Liver Function Tests: Recent Labs  Lab 04/20/20 0545  AST 37  ALT 22  ALKPHOS 46  BILITOT 1.0  PROT 6.0*  ALBUMIN 3.1*    Recent Results (from the past 240 hour(s))  SARS Coronavirus 2 by RT PCR (hospital order, performed in [facility] hospital lab) Nasopharyngeal Nasopharyngeal Swab     Status: Abnormal   Collection Time: 04/19/20 10:11 PM   Specimen: Nasopharyngeal Swab  Result Value Ref Range Status   SARS Coronavirus 2 POSITIVE (A) NEGATIVE Final    Comment: RESULT CALLED TO, READ BACK BY AND VERIFIED WITH: NOEL WEBSTER RN 04/19/2020 2343 JG (NOTE) SARS-CoV-2 target nucleic acids are DETECTED  SARS-CoV-2 RNA is generally detectable  in upper respiratory specimens  during the acute phase of infection.  Positive results are indicative  of the presence of the identified virus, but do not rule out bacterial infection or co-infection with other pathogens not detected by the test.  Clinical correlation with patient history and  other diagnostic information is necessary to determine patient infection status.  The expected result is negative.  Fact Sheet for Patients:   StrictlyIdeas.no   Fact Sheet for Healthcare Providers:   BankingDealers.co.za    This test is not yet approved or cleared by the [place] FDA and  has been authorized for detection and/or diagnosis of SARS-CoV-2 by FDA under an Emergency Use Authorization (EUA).  This EUA will remain in effect (meaning this tes t can be used) for the duration of  the COVID-19 declaration under Section 564(b)(1) of the Act, 21 U.S.C. section 360-bbb-3(b)(1), unless the authorization is terminated or revoked sooner.  Performed at [facility], [address] [place], [place] [address]      Scheduled Meds: . abiraterone acetate  1,000 mg Oral Daily  . cholecalciferol  5,000 Units Oral Weekly  . enoxaparin (LOVENOX) injection  40 mg Subcutaneous Q24H  . predniSONE  5 mg Oral Q breakfast     LOS: 1 day   Cherene Altes, MD [facility] Office  [phone] Pager - Text Page per Amion  If 7PM-7AM, please contact night-coverage per Amion 04/21/2020, 8:11 AM

## 2020-04-21 NOTE — Evaluation (Signed)
Physical Therapy Evaluation Patient Details Name: Alexis Cruz MRN: [id number] DOB: 05-22-1935 Today's Date: 04/21/2020   History of Present Illness  presented to ER secondary to nausea, vomiting, diarrhea; admitted for management of COVID-19 viral infection.  Clinical Impression  Upon evaluation, patient alert and oriented; follows commands and agreeable to participation with session with min encouragement from therapist. Denies pain.  Bilat UE/LE strength and ROM grossly symmetrical and WFL; no focal weakness appreciated.  Able to complete bed mobility indep; sit/stand, basic transfers and gait (200') without assist device, sup/mod indep. Demonstrates reciprocal stepping pattern with fair step height/length; good trunk rotation and arm swing. Mild sway with dynamic gait components, but self-recovers without difficulty or LOB. Would benefit from skilled PT to address above deficits and promote optimal return to PLOF.; will maintain on caseload throughout remaining stay to ensure progressive mobility.  Anticipate no formal PT needs upon discharge     Follow Up Recommendations No PT follow up    Equipment Recommendations       Recommendations for Other Services       Precautions / Restrictions Precautions Precautions: None Restrictions Weight Bearing Restrictions: No      Mobility  Bed Mobility Overal bed mobility: Independent                  Transfers Overall transfer level: Modified independent Equipment used: None                Ambulation/Gait Ambulation/Gait assistance: Supervision Gait Distance (Feet): 200 Feet Assistive device: None       General Gait Details: reciprocal stepping pattern with fair step height/length; good trunk rotation and arm swing. Mild sway with dynamic gait components, but self-recovers without difficulty or LOB.  Stairs            Wheelchair Mobility    Modified Rankin (Stroke Patients Only)       Balance  Overall balance assessment: Needs assistance Sitting-balance support: No upper extremity supported;Feet supported Sitting balance-Leahy Scale: Normal     Standing balance support: No upper extremity supported Standing balance-Leahy Scale: Good                               Pertinent Vitals/Pain Pain Assessment: No/denies pain    Home Living Family/patient expects to be discharged to:: Private residence Living Arrangements: Spouse/significant other Available Help at Discharge: Family Type of Home: Apartment Home Access: Level entry     Home Layout: One level Home Equipment: None      Prior Function Level of Independence: Independent         Comments: Indep wiht ADLs, household and community mobilization without assist device; no home O2; no fall history.     Hand Dominance        Extremity/Trunk Assessment   Upper Extremity Assessment Upper Extremity Assessment: Overall WFL for tasks assessed    Lower Extremity Assessment Lower Extremity Assessment: Overall WFL for tasks assessed (grossly at least 4/5 throughout)       Communication   Communication: No difficulties  Cognition Arousal/Alertness: Awake/alert Behavior During Therapy: WFL for tasks assessed/performed Overall Cognitive Status: Within Functional Limits for tasks assessed                                        General Comments      Exercises Other Exercises  Other Exercises: Sit/stand from edge of bed, recliner without assist device, mod indep; mobilizes throughout room environment (negotiates obstacles, turns) without difficulty. Other Exercises: Educated in role of PT and progressive mobility; initiated education regarding activity pacing and energy conservation as needed.  Patient voiced understanding.   Assessment/Plan    PT Assessment Patient needs continued PT services  PT Problem List Decreased activity tolerance;Decreased balance;Decreased  mobility;Cardiopulmonary status limiting activity       PT Treatment Interventions DME instruction;Gait training;Functional mobility training;Therapeutic activities;Therapeutic exercise;Balance training;Patient/family education    PT Goals (Current goals can be found in the Care Plan section)  Acute Rehab PT Goals Patient Stated Goal: to get my strength back PT Goal Formulation: With patient Time For Goal Achievement: 05/05/20 Potential to Achieve Goals: Good Additional Goals Additional Goal #1: Indep understanding and use of activity pacing/energy conservation strategies with functional tasks.    Frequency Min 2X/week   Barriers to discharge        Co-evaluation               AM-PAC PT "6 Clicks" Mobility  Outcome Measure Help needed turning from your back to your side while in a flat bed without using bedrails?: None Help needed moving from lying on your back to sitting on the side of a flat bed without using bedrails?: None Help needed moving to and from a bed to a chair (including a wheelchair)?: None Help needed standing up from a chair using your arms (e.g., wheelchair or bedside chair)?: None Help needed to walk in hospital room?: None Help needed climbing 3-5 steps with a railing? : A Little 6 Click Score: 23    End of Session Equipment Utilized During Treatment: Gait belt Activity Tolerance: Patient tolerated treatment well Patient left: in chair;with call bell/phone within reach Nurse Communication: Mobility status PT Visit Diagnosis: Muscle weakness (generalized) (M62.81);Difficulty in walking, not elsewhere classified (R26.2)    Time: 4403-4742 PT Time Calculation (min) (ACUTE ONLY): 22 min   Charges:   PT Evaluation $PT Eval Moderate Complexity: 1 Mod PT Treatments $Therapeutic Activity: 8-22 mins        Alianis Trimmer H. Owens Shark, PT, DPT, NCS 04/21/20, 10:28 AM [phone]

## 2020-04-21 NOTE — Progress Notes (Signed)
OT Screen Note  Patient Details Name: Alexis Cruz MRN: [id number] DOB: 1935-09-13   Cancelled Treatment:    Reason Eval/Treat Not Completed: OT screened, no needs identified, will sign off. Consult received, chart reviewed. Pt seated EOB, fully dressed upon OT arrival. Pt denies difficulties doing ADL tasks, just "weak" 2/2 a clear liquids diet. Per pt request, secure chat sent to RN inquiring about his diet order. Pt demo's modified independence with ADL mobility and ADL tasks. Will have 24/7 assist at home. No skilled OT needs identified. Will sign off.   Jeni Salles, MPH, MS, OTR/L ascom [phone] 04/21/20, 11:05 AM

## 2020-04-22 DIAGNOSIS — E876 Hypokalemia: Secondary | ICD-10-CM | POA: Diagnosis not present

## 2020-04-22 LAB — CBC
HCT: 23.5 % — ABNORMAL LOW (ref 39.0–52.0)
Hemoglobin: 8.4 g/dL — ABNORMAL LOW (ref 13.0–17.0)
MCH: 31.6 pg (ref 26.0–34.0)
MCHC: 35.7 g/dL (ref 30.0–36.0)
MCV: 88.3 fL (ref 80.0–100.0)
Platelets: 161 10*3/uL (ref 150–400)
RBC: 2.66 MIL/uL — ABNORMAL LOW (ref 4.22–5.81)
RDW: 14.3 % (ref 11.5–15.5)
WBC: 4.6 10*3/uL (ref 4.0–10.5)
nRBC: 0 % (ref 0.0–0.2)

## 2020-04-22 LAB — BASIC METABOLIC PANEL
Anion gap: 10 (ref 5–15)
BUN: 12 mg/dL (ref 8–23)
CO2: 23 mmol/L (ref 22–32)
Calcium: 8.5 mg/dL — ABNORMAL LOW (ref 8.9–10.3)
Chloride: 102 mmol/L (ref 98–111)
Creatinine, Ser: 0.93 mg/dL (ref 0.61–1.24)
GFR, Estimated: >60 mL/min (ref >60)
Glucose, Bld: 96 mg/dL (ref 70–99)
Potassium: 3.9 mmol/L (ref 3.5–5.1)
Sodium: 135 mmol/L (ref 135–145)

## 2020-04-22 NOTE — Discharge Summary (Signed)
DISCHARGE SUMMARY  Jarone Ostergaard  MR#: [id number]  DOB:[birth year]  Date of Admission: 04/19/2020 Date of Discharge: 04/22/2020  Attending Physician:Alanis Clift Hennie Duos, MD  Patient's NIO:[id number], Ashdown Sink, MD  Consults: none  Disposition: D/C home  Date of Positive COVID Test: 04/19/20 [facility] ED)  Date Isolation Ends: 04/25/20  COVID-19 specific Treatment: Refused Remdesivir 3 day course   Follow-up Appts:  Follow-up Information    Radiontchenko, Alexei, MD Follow up in 7 day(s).   Specialty: Family Medicine Contact information: [address] [place] [address] [phone]               Discharge Diagnoses: Covid gastroenteritis Hyponatremia Severe Hypokalemia Prostate cancer metastatic to bone Normocytic anemia  HTN PMR OSA  Initial presentation: 85 year old with a history of prostate cancer metastatic to bone, HTN, OSA not on CPAP, PMR, and HLD who presented to the [facility] ER with nausea, vomiting, diarrhea, and progressive severe weakness.  He is actively undergoing immunotherapy/chemotherapy for his prostate cancer.  He was known to have tested positive for Covid in the outpatient setting approximately 1 week prior to this presentation.  He is not vaccinated.  Hospital Course:  Covid gastroenteritis provided supportive care - no evidence of resp sx during this admit - appears to be much improved from GI standpoint at time of d/c - tolerating regular diet w/o N/V  Hyponatremia Due to dehydration - resolved w/ volume expansion    Severe Hypokalemia Due to GI losses and poor intake - resolved w/ supplementation   Prostate cancer metastatic to bone Continue usual outpatient therapy  Normocytic anemia  Likely a consequence of "chronic disease"/prostate cancer - Hgb stable during this admit   HTN BP well controlled - ACE/diurtiec combo stopped   PMR No evidence of an acute flare at present  OSA Does not use CPAP at  home  Allergies as of 04/22/2020      Reactions   Citalopram Other (See Comments)   Unsure but family remembers that he could not take the dru-not sure what happened      Medication List    STOP taking these medications   ibuprofen 200 MG tablet Commonly known as: ADVIL   lisinopril-hydrochlorothiazide 20-12.5 MG tablet Commonly known as: ZESTORETIC     TAKE these medications   abiraterone acetate 250 MG tablet Commonly known as: ZYTIGA TAKE 4 TABLETS (1,000 MG TOTAL) BY MOUTH DAILY. TAKE ON AN EMPTY STOMACH 1 HOUR BEFORE OR 2 HOURS AFTER A MEAL   Cholecalciferol 125 MCG (5000 UT) capsule Take 5,000 Units by mouth once a week.   clonazePAM 0.5 MG tablet Commonly known as: KLONOPIN Take 1 tablet (0.5 mg total) by mouth 2 (two) times daily as needed.   ondansetron 4 MG tablet Commonly known as: Zofran Take 1 tablet (4 mg total) by mouth every 6 (six) hours as needed for nausea or vomiting.   predniSONE 5 MG tablet Commonly known as: DELTASONE Take 1 tablet (5 mg total) by mouth daily with breakfast.       Day of Discharge BP 136/74   Pulse 91   Temp 97.7 F (36.5 C)   Resp 16   Wt 71.8 kg   SpO2 98%   BMI 26.36 kg/m   Physical Exam: General: No acute respiratory distress Lungs: Clear to auscultation bilaterally without wheezes or crackles Cardiovascular: Regular rate and rhythm without murmur gallop or rub normal S1 and S2 Abdomen: Nontender, nondistended, soft, bowel sounds positive, no rebound, no ascites, no appreciable mass Extremities:  No significant cyanosis, clubbing, or edema bilateral lower extremities  Basic Metabolic Panel: Recent Labs  Lab 04/19/20 1806 04/19/20 2200 04/20/20 0545 04/21/20 0413 04/21/20 1426 04/22/20 0700  NA  --  127* 131* 134* 134* 135  K  --  2.6* 3.0* 2.6* 3.5 3.9  CL  --  90* 96* 98 98 102  CO2  --  26 24 24 25 23   GLUCOSE  --  128* 102* 94 99 96  BUN  --  19 16 12 12 12   CREATININE  --  1.03 1.05 0.92 0.96 0.93   CALCIUM  --  8.1* 8.3* 8.5* 8.6* 8.5*  MG 2.2  --   --  2.0  --   --     Liver Function Tests: Recent Labs  Lab 04/20/20 0545  AST 37  ALT 22  ALKPHOS 46  BILITOT 1.0  PROT 6.0*  ALBUMIN 3.1*    CBC: Recent Labs  Lab 04/19/20 1741 04/20/20 0545 04/21/20 0413 04/22/20 0700  WBC 7.8 4.9 4.7 4.6  HGB 9.3* 8.6* 8.6* 8.4*  HCT 25.7* 23.9* 23.2* 23.5*  MCV 86.5 87.9 85.9 88.3  PLT 129* 126* 146* 161     Recent Results (from the past 240 hour(s))  SARS Coronavirus 2 by RT PCR (hospital order, performed in [facility] hospital lab) Nasopharyngeal Nasopharyngeal Swab     Status: Abnormal   Collection Time: 04/19/20 10:11 PM   Specimen: Nasopharyngeal Swab  Result Value Ref Range Status   SARS Coronavirus 2 POSITIVE (A) NEGATIVE Final    Comment: RESULT CALLED TO, READ BACK BY AND VERIFIED WITH: NOEL WEBSTER RN 04/19/2020 2343 JG (NOTE) SARS-CoV-2 target nucleic acids are DETECTED  SARS-CoV-2 RNA is generally detectable in upper respiratory specimens  during the acute phase of infection.  Positive results are indicative  of the presence of the identified virus, but do not rule out bacterial infection or co-infection with other pathogens not detected by the test.  Clinical correlation with patient history and  other diagnostic information is necessary to determine patient infection status.  The expected result is negative.  Fact Sheet for Patients:   StrictlyIdeas.no   Fact Sheet for Healthcare Providers:   BankingDealers.co.za    This test is not yet approved or cleared by the [place] FDA and  has been authorized for detection and/or diagnosis of SARS-CoV-2 by FDA under an Emergency Use Authorization (EUA).  This EUA will remain in effect (meaning this tes t can be used) for the duration of  the COVID-19 declaration under Section 564(b)(1) of the Act, 21 U.S.C. section 360-bbb-3(b)(1), unless the authorization  is terminated or revoked sooner.  Performed at [facility], [address] [place], [place] [address]       Time spent in discharge (includes decision making & examination of pt): 35 minutes  04/22/2020, 11:58 AM   Cherene Altes, MD [facility] Office  [phone]

## 2020-04-22 NOTE — Discharge Instructions (Signed)
Date of Positive COVID Test: 04/19/20 [facility] ED)  Date Isolation Ends: 04/25/20 (in other words 04/24/20 is the last full day you need to remain on isolation)   COVID-19: What to Do if You Are Sick If you have a fever, cough or other symptoms, you might have COVID-19. Most people have mild illness and are able to recover at home. If you are sick:  Keep track of your symptoms.  If you have an emergency warning sign (including trouble breathing), call 911. Steps to help prevent the spread of COVID-19 if you are sick If you are sick with COVID-19 or think you might have COVID-19, follow the steps below to care for yourself and to help protect other people in your home and community. Stay home except to get medical care  Stay home. Most people with COVID-19 have mild illness and can recover at home without medical care. Do not leave your home, except to get medical care. Do not visit public areas.  Take care of yourself. Get rest and stay hydrated. Take over-the-counter medicines, such as acetaminophen, to help you feel better.  Stay in touch with your doctor. Call before you get medical care. Be sure to get care if you have trouble breathing, or have any other emergency warning signs, or if you think it is an emergency.  Avoid public transportation, ride-sharing, or taxis. Separate yourself from other people As much as possible, stay in a specific room and away from other people and pets in your home. If possible, you should use a separate bathroom. If you need to be around other people or animals in or outside of the home, wear a mask. Tell your close contactsthat they may have been exposed to COVID-19. An infected person can spread COVID-19 starting 48 hours (or 2 days) before the person has any symptoms or tests positive. By letting your close contacts know they may have been exposed to COVID-19, you are helping to protect everyone.  Additional guidance is available for those living in close  quarters and shared housing.  See COVID-19 and Animals if you have questions about pets.  If you are diagnosed with COVID-19, someone from the health department may call you. Answer the call to slow the spread. Monitor your symptoms  Symptoms of COVID-19 include fever, cough, or other symptoms.  Follow care instructions from your healthcare provider and local health department. Your local health authorities may give instructions on checking your symptoms and reporting information. When to seek emergency medical attention Look for emergency warning signs* for COVID-19. If someone is showing any of these signs, seek emergency medical care immediately:  Trouble breathing  Persistent pain or pressure in the chest  New confusion  Inability to wake or stay awake  Pale, gray, or blue-colored skin, lips, or nail beds, depending on skin tone *This list is not all possible symptoms. Please call your medical provider for any other symptoms that are severe or concerning to you. Call 911 or call ahead to your local emergency facility: Notify the operator that you are seeking care for someone who has or may have COVID-19. Call ahead before visiting your doctor  Call ahead. Many medical visits for routine care are being postponed or done by phone or telemedicine.  If you have a medical appointment that cannot be postponed, call your doctor's office, and tell them you have or may have COVID-19. This will help the office protect themselves and other patients. Get  tested  If you have symptoms  of COVID-19, get tested. While waiting for test results, you stay away from others, including staying apart from those living in your household.  You can visit your state, tribal, local, and territorialhealth department's website to look for the latest local information on testing sites. If you are sick, wear a mask over your nose and mouth  You should wear a mask over your nose and mouth if you must be  around other people or animals, including pets (even at home).  You don't need to wear the mask if you are alone. If you can't put on a mask (because of trouble breathing, for example), cover your coughs and sneezes in some other way. Try to stay at least 6 feet away from other people. This will help protect the people around you.  Masks should not be placed on young children under age 53 years, anyone who has trouble breathing, or anyone who is not able to remove the mask without help. Note: During the COVID-19 pandemic, medical grade facemasks are reserved for healthcare workers and some first responders. Cover your coughs and sneezes  Cover your mouth and nose with a tissue when you cough or sneeze.  Throw away used tissues in a lined trash can.  Immediately wash your hands with soap and water for at least 20 seconds. If soap and water are not available, clean your hands with an alcohol-based hand sanitizer that contains at least 60% alcohol. Clean your hands often  Wash your hands often with soap and water for at least 20 seconds. This is especially important after blowing your nose, coughing, or sneezing; going to the bathroom; and before eating or preparing food.  Use hand sanitizer if soap and water are not available. Use an alcohol-based hand sanitizer with at least 60% alcohol, covering all surfaces of your hands and rubbing them together until they feel dry.  Soap and water are the best option, especially if hands are visibly dirty.  Avoid touching your eyes, nose, and mouth with unwashed hands.  Handwashing Tips Avoid sharing personal household items  Do not share dishes, drinking glasses, cups, eating utensils, towels, or bedding with other people in your home.  Wash these items thoroughly after using them with soap and water or put in the dishwasher. Clean all "high-touch" surfaces everyday  Clean and disinfect high-touch surfaces in your "sick room" and bathroom; wear  disposable gloves. Let someone else clean and disinfect surfaces in common areas, but you should clean your bedroom and bathroom, if possible.  If a caregiver or other person needs to clean and disinfect a sick person's bedroom or bathroom, they should do so on an as-needed basis. The caregiver/other person should wear a mask and disposable gloves prior to cleaning. They should wait as long as possible after the person who is sick has used the bathroom before coming in to clean and use the bathroom. ? High-touch surfaces include phones, remote controls, counters, tabletops, doorknobs, bathroom fixtures, toilets, keyboards, tablets, and bedside tables.  Clean and disinfect areas that may have blood, stool, or body fluids on them.  Use household cleaners and disinfectants. Clean the area or item with soap and water or another detergent if it is dirty. Then, use a household disinfectant. ? Be sure to follow the instructions on the label to ensure safe and effective use of the product. Many products recommend keeping the surface wet for several minutes to ensure germs are killed. Many also recommend precautions such as wearing gloves and making  sure you have good ventilation during use of the product. ? Use a product from [employer] List N: Disinfectants for Coronavirus (JSEGB-15). ? Complete Disinfection Guidance When you can be around others after being sick with COVID-19 Deciding when you can be around others is different for different situations. Find out when you can safely end home isolation. For any additional questions about your care, contact your healthcare provider or state or local health department. 06/06/2019 Content source: [facility] ([facility]), Division of Viral Diseases This information is not intended to replace advice given to you by your health care provider. Make sure you discuss any questions you have with your health care provider. Document  Revised: 01/21/2020 Document Reviewed: 01/21/2020 Elsevier Patient Education  2021 [place] Can Do to Manage Your COVID-19 Symptoms at Home If you have possible or confirmed COVID-19: 1. Stay home except to get medical care. 2. Monitor your symptoms carefully. If your symptoms get worse, call your healthcare provider immediately. 3. Get rest and stay hydrated. 4. If you have a medical appointment, call the healthcare provider ahead of time and tell them that you have or may have COVID-19. 5. For medical emergencies, call 911 and notify the dispatch personnel that you have or may have COVID-19. 6. Cover your cough and sneezes with a tissue or use the inside of your elbow. 7. Wash your hands often with soap and water for at least 20 seconds or clean your hands with an alcohol-based hand sanitizer that contains at least 60% alcohol. 8. As much as possible, stay in a specific room and away from other people in your home. Also, you should use a separate bathroom, if available. If you need to be around other people in or outside of the home, wear a mask. 9. Avoid sharing personal items with other people in your household, like dishes, towels, and bedding. 10. Clean all surfaces that are touched often, like counters, tabletops, and doorknobs. Use household cleaning sprays or wipes according to the label instructions. [email or web address].com 10/05/2019 This information is not intended to replace advice given to you by your health care provider. Make sure you discuss any questions you have with your health care provider. Document Revised: 01/21/2020 Document Reviewed: 01/21/2020 Elsevier Patient Education  2021 [employer].

## 2020-04-25 ENCOUNTER — Other Ambulatory Visit: Payer: Self-pay | Admitting: Oncology

## 2020-05-05 ENCOUNTER — Other Ambulatory Visit: Payer: Self-pay | Admitting: Oncology

## 2020-05-05 DIAGNOSIS — Z191 Hormone sensitive malignancy status: Secondary | ICD-10-CM

## 2020-05-05 DIAGNOSIS — C61 Malignant neoplasm of prostate: Secondary | ICD-10-CM

## 2020-05-06 ENCOUNTER — Inpatient Hospital Stay: Payer: Medicare Other

## 2020-05-06 ENCOUNTER — Inpatient Hospital Stay: Payer: Medicare Other | Attending: Oncology

## 2020-05-06 ENCOUNTER — Encounter: Payer: Self-pay | Admitting: Oncology

## 2020-05-06 ENCOUNTER — Inpatient Hospital Stay (HOSPITAL_BASED_OUTPATIENT_CLINIC_OR_DEPARTMENT_OTHER): Payer: Medicare Other | Admitting: Oncology

## 2020-05-06 VITALS — BP 100/55 | HR 85 | Temp 97.9°F | Resp 16 | Ht 65.0 in | Wt 158.6 lb

## 2020-05-06 DIAGNOSIS — C61 Malignant neoplasm of prostate: Secondary | ICD-10-CM | POA: Insufficient documentation

## 2020-05-06 DIAGNOSIS — Z79899 Other long term (current) drug therapy: Secondary | ICD-10-CM | POA: Insufficient documentation

## 2020-05-06 DIAGNOSIS — Z5111 Encounter for antineoplastic chemotherapy: Secondary | ICD-10-CM | POA: Diagnosis present

## 2020-05-06 DIAGNOSIS — Z191 Hormone sensitive malignancy status: Secondary | ICD-10-CM

## 2020-05-06 DIAGNOSIS — C7951 Secondary malignant neoplasm of bone: Secondary | ICD-10-CM | POA: Diagnosis not present

## 2020-05-06 DIAGNOSIS — G893 Neoplasm related pain (acute) (chronic): Secondary | ICD-10-CM | POA: Insufficient documentation

## 2020-05-06 DIAGNOSIS — F419 Anxiety disorder, unspecified: Secondary | ICD-10-CM | POA: Insufficient documentation

## 2020-05-06 DIAGNOSIS — R5383 Other fatigue: Secondary | ICD-10-CM | POA: Insufficient documentation

## 2020-05-06 LAB — CBC WITH DIFFERENTIAL/PLATELET
Abs Immature Granulocytes: 0.08 10*3/uL — ABNORMAL HIGH (ref 0.00–0.07)
Basophils Absolute: 0 10*3/uL (ref 0.0–0.1)
Basophils Relative: 0 %
Eosinophils Absolute: 0.1 10*3/uL (ref 0.0–0.5)
Eosinophils Relative: 2 %
HCT: 29.1 % — ABNORMAL LOW (ref 39.0–52.0)
Hemoglobin: 9.7 g/dL — ABNORMAL LOW (ref 13.0–17.0)
Immature Granulocytes: 1 %
Lymphocytes Relative: 14 %
Lymphs Abs: 1.2 10*3/uL (ref 0.7–4.0)
MCH: 31.2 pg (ref 26.0–34.0)
MCHC: 33.3 g/dL (ref 30.0–36.0)
MCV: 93.6 fL (ref 80.0–100.0)
Monocytes Absolute: 0.7 10*3/uL (ref 0.1–1.0)
Monocytes Relative: 9 %
Neutro Abs: 6.1 10*3/uL (ref 1.7–7.7)
Neutrophils Relative %: 74 %
Platelets: 206 10*3/uL (ref 150–400)
RBC: 3.11 MIL/uL — ABNORMAL LOW (ref 4.22–5.81)
RDW: 17 % — ABNORMAL HIGH (ref 11.5–15.5)
WBC: 8.2 10*3/uL (ref 4.0–10.5)
nRBC: 0 % (ref 0.0–0.2)

## 2020-05-06 LAB — COMPREHENSIVE METABOLIC PANEL
ALT: 14 U/L (ref 0–44)
AST: 23 U/L (ref 15–41)
Albumin: 3.8 g/dL (ref 3.5–5.0)
Alkaline Phosphatase: 68 U/L (ref 38–126)
Anion gap: 13 (ref 5–15)
BUN: 27 mg/dL — ABNORMAL HIGH (ref 8–23)
CO2: 24 mmol/L (ref 22–32)
Calcium: 9.2 mg/dL (ref 8.9–10.3)
Chloride: 99 mmol/L (ref 98–111)
Creatinine, Ser: 1.03 mg/dL (ref 0.61–1.24)
GFR, Estimated: >60 mL/min (ref >60)
Glucose, Bld: 110 mg/dL — ABNORMAL HIGH (ref 70–99)
Potassium: 3.5 mmol/L (ref 3.5–5.1)
Sodium: 136 mmol/L (ref 135–145)
Total Bilirubin: 1.1 mg/dL (ref 0.3–1.2)
Total Protein: 7.6 g/dL (ref 6.5–8.1)

## 2020-05-06 LAB — PSA: Prostatic Specific Antigen: 0.02 ng/mL (ref 0.00–4.00)

## 2020-05-06 MED ORDER — LEUPROLIDE ACETATE (3 MONTH) 22.5 MG ~~LOC~~ KIT
22.5000 mg | PACK | SUBCUTANEOUS | Status: AC
Start: 2020-05-06 — End: 2025-04-10
  Administered 2020-05-06: 22.5 mg via SUBCUTANEOUS
  Filled 2020-05-06: qty 22.5

## 2020-05-06 NOTE — Progress Notes (Signed)
The pt and husband had covid but ended up in hospital with GI sx.. he states that it is hard to swallow pills and he has lots of fatigue. B/p low today 100/55

## 2020-05-08 MED FILL — ABIRATERONE ACETATE 250 MG: 250 | 30 days supply | Qty: 120 | Fill #0

## 2020-05-09 ENCOUNTER — Encounter: Payer: Self-pay | Admitting: Oncology

## 2020-05-09 NOTE — Progress Notes (Signed)
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 Posey Pronto for symptoms of left hip pain which prompted x-rays followed by MRI of the left hip without contrast. 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 Patient was subsequently seen by orthopedics Dr. Pathology who did not recommend any orthopedic intervention for the left hip fracture he has been referred to oncology for further management patient's last PSA was checked in 2014 which was elevated at 7.4. Patient is also a chronic smoker and smokes about 1 pack of cigarettes per day since 1968.  Head CT scan showed widespread bony metastatic disease with pathologic fracture of the left acetabulum. Pathologic fracture of the T3 vertebral body with 50% loss of height possible fracture of the left seventh rib.No evidence of visceral metastases. PSA elevated at 35.Bone biopsy consistent with prostate adenocarcinoma.  Unable to obtain NGS testing on bone sample  Patient received Mills Koller and is currently on Zytiga  Patient also seen at [facility] for second opinion related with [facility] and ADT.  They recommended bisphosphonates given pathologic fracture of the acetabulum  Interval history-patient and his wife recently recovered from Covid 2 to 3 weeks ago.  He does report significant fatigue presently.  He  continues to be compliant with Zytiga.  Hip pain is currently well controlled and patient is not taking any opioid medications.  He does use Klonopin for his anxiety which has been helping him.  ECOG PS- 2 Pain scale- 0 Opioid associated constipation- no  Review of systems- Review of Systems  Constitutional: Positive for malaise/fatigue. Negative for chills, fever and weight loss.  HENT: Negative for congestion, ear discharge and nosebleeds.   Eyes: Negative for blurred vision.  Respiratory: Negative for cough, hemoptysis, sputum production, shortness of breath and wheezing.   Cardiovascular: Negative for chest pain, palpitations, orthopnea and claudication.  Gastrointestinal: Negative for abdominal pain, blood in stool, constipation, diarrhea, heartburn, melena, nausea and vomiting.  Genitourinary: Negative for dysuria, flank pain, frequency, hematuria and urgency.  Musculoskeletal: Negative for back pain, joint pain and myalgias.  Skin: Negative for rash.  Neurological: Negative for dizziness, tingling, focal weakness, seizures, weakness and headaches.  Endo/Heme/Allergies: Does not bruise/bleed easily.  Psychiatric/Behavioral: Negative for depression and suicidal ideas. The patient does not have insomnia.       Allergies  Allergen Reactions  . Citalopram Other (See Comments)    Unsure but family remembers that he could not take the dru-not sure what happened     Past Medical History:  Diagnosis Date  . Hypertension   . Prostate cancer [facility])      History reviewed. No pertinent surgical history.  Social History   Socioeconomic History  . Marital status: Married    Spouse name: Not on file  . Number of children: Not on file  . Years of education: Not on file  . Highest education level: Not on file  Occupational History  . Not on file  Tobacco Use  .  Smoking status: Former Smoker    Types: Pipe  . Smokeless tobacco: Former Systems developer  . Tobacco comment: stopped pipes 3  month ago  Vaping Use  . Vaping Use: Never used  Substance and Sexual Activity  . Alcohol use: Not Currently  . Drug use: Never  . Sexual activity: Not on file  Other Topics Concern  . Not on file  Social History Narrative  . Not on file   Social Determinants of Health   Financial Resource Strain: Not on file  Food Insecurity: Not on file  Transportation Needs: Not on file  Physical Activity: Not on file  Stress: Not on file  Social Connections: Not on file  Intimate Partner Violence: Not on file    No family history on file.   Current Outpatient Medications:  .  abiraterone acetate (ZYTIGA) 250 MG tablet, TAKE 4 TABLETS (1,000 MG TOTAL) BY MOUTH DAILY. TAKE ON AN EMPTY STOMACH 1 HOUR BEFORE OR 2 HOURS AFTER A MEAL, Disp: 120 tablet, Rfl: 0 .  Cholecalciferol 125 MCG (5000 UT) capsule, Take 5,000 Units by mouth once a week., Disp: , Rfl:  .  clonazePAM (KLONOPIN) 0.5 MG tablet, Take 1 tablet by mouth twice daily as needed, Disp: 60 tablet, Rfl: 0 .  lisinopril-hydrochlorothiazide (ZESTORETIC) 20-12.5 MG tablet, Take 1 tablet by mouth once daily, Disp: 30 tablet, Rfl: 0 .  predniSONE (DELTASONE) 5 MG tablet, Take 1 tablet (5 mg total) by mouth daily with breakfast., Disp: 30 tablet, Rfl: 3 No current facility-administered medications for this visit.  Facility-Administered Medications Ordered in Other Visits:  .  Leuprolide Acetate (3 Month) (ELIGARD) 22.5 MG injection 22.5 mg, 22.5 mg, Subcutaneous, Q90 days, Sindy Guadeloupe, MD, 22.5 mg at 05/06/20 1116  Physical exam:  Vitals:   05/06/20 1030  BP: (!) 100/55  Pulse: 85  Resp: 16  Temp: 97.9 F (36.6 C)  TempSrc: Oral  Weight: 158 lb 9.6 oz (71.9 kg)  Height: 5\' 5"  (1.651 m)   Physical Exam Constitutional:      Comments: Sitting in a wheelchair.  Appears fatigued  Eyes:     Extraocular Movements: EOM normal.  Cardiovascular:     Rate and Rhythm: Normal rate and regular rhythm.     Heart sounds: Normal heart  sounds.  Pulmonary:     Effort: Pulmonary effort is normal.     Breath sounds: Normal breath sounds.  Abdominal:     General: Bowel sounds are normal.     Palpations: Abdomen is soft.  Skin:    General: Skin is warm and dry.  Neurological:     Mental Status: He is alert and oriented to person, place, and time.      CMP Latest Ref Rng & Units 05/06/2020  Glucose 70 - 99 mg/dL 110(H)  BUN 8 - 23 mg/dL 27(H)  Creatinine 0.61 - 1.24 mg/dL 1.03  Sodium 135 - 145 mmol/L 136  Potassium 3.5 - 5.1 mmol/L 3.5  Chloride 98 - 111 mmol/L 99  CO2 22 - 32 mmol/L 24  Calcium 8.9 - 10.3 mg/dL 9.2  Total Protein 6.5 - 8.1 g/dL 7.6  Total Bilirubin 0.3 - 1.2 mg/dL 1.1  Alkaline Phos 38 - 126 U/L 68  AST 15 - 41 U/L 23  ALT 0 - 44 U/L 14   CBC Latest Ref Rng & Units 05/06/2020  WBC 4.0 - 10.5 K/uL 8.2  Hemoglobin 13.0 - 17.0 g/dL 9.7(L)  Hematocrit 39.0 - 52.0 % 29.1(L)  Platelets 150 -  400 K/uL 206    No images are attached to the encounter.  DG Chest Portable 1 View  Result Date: 04/19/2020 CLINICAL DATA:  Weakness.  COVID positive 6 days ago. EXAM: PORTABLE CHEST 1 VIEW COMPARISON:  PET CT 11/15/2019.  Remote chest CT 07/15/2007 FINDINGS: Mild hyperinflation and bronchial thickening. Ill-defined opacity in the left greater than right lung base. Heart is normal in size. Normal mediastinal contours with aortic atherosclerosis. No evidence of pulmonary edema. No pleural fluid. No pneumothorax. No acute osseous abnormalities are seen. Patient with known osseous metastatic disease that is not well demonstrated by radiograph. IMPRESSION: 1. Ill-defined bibasilar opacity, left greater than right, suspicious for pneumonia in the setting of COVID 19. 2. Mild hyperinflation and bronchial thickening, can be seen with asthma, bronchitis, or smoking related lung disease. Electronically Signed   By: Keith Rake M.D.   On: 04/19/2020 19:24     Assessment and plan- Patient is a 85 y.o. 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  He will continue taking Zytiga with prednisone.  He has responded Regimen well so far.  PSA has come down to 0.02 from a baseline of 35.02.  I will plan to get repeat CT chest abdomen pelvis with contrast and bone scan in 3 months and see him back at that time with CBC with differential CMP and PSA.  I did give him the option to hold Zytiga for a couple of months if he feels that his fatigue is getting worse especially after COVID.  Patient would like to continue his Zytiga.    Anxiety: Currently well controlled With as needed Klonopin and we had refill his prescription on 04/25/2020.  I did look at the recommendations from [place] regarding his prostate cancer and they had recommended considering bisphosphonates given that he had pathologic hip fracture even if he has castrate sensitive disease.  I did discuss these recommendations with patient and his wife.  Discussed that both Zometa which is a 30-minute infusion versus Xgeva which is a subcutaneous injection are options to be given on a monthly basis.  Discussed risks and benefits of bisphosphonates including all but not limited to fatigue, hypocalcemia and osteonecrosis of the jaw.  Discussed the need to get dental clearance prior to starting bisphosphonates.  Patient and his wife would like to think about it and get back to [place] and discuss further in 3 months.  If he is able to obtain dental clearance in the interim he will let us know so that we can fax the clearance to his dentist.   Visit Diagnosis 1. Metastatic castration-sensitive adenocarcinoma of prostate ([place])   2. High risk medication use   3. Neoplasm related pain      Dr. Randa Evens, MD, MPH [facility] [phone] 05/09/2020 8:46 AM

## 2020-05-25 ENCOUNTER — Other Ambulatory Visit: Payer: Self-pay | Admitting: Oncology

## 2020-06-03 ENCOUNTER — Other Ambulatory Visit: Payer: Self-pay | Admitting: Oncology

## 2020-06-03 DIAGNOSIS — C61 Malignant neoplasm of prostate: Secondary | ICD-10-CM

## 2020-06-03 DIAGNOSIS — Z191 Hormone sensitive malignancy status: Secondary | ICD-10-CM

## 2020-06-13 ENCOUNTER — Other Ambulatory Visit (HOSPITAL_COMMUNITY): Payer: Self-pay

## 2020-06-18 ENCOUNTER — Other Ambulatory Visit: Payer: Self-pay | Admitting: Nurse Practitioner

## 2020-06-24 ENCOUNTER — Other Ambulatory Visit: Payer: Self-pay | Admitting: Nurse Practitioner

## 2020-06-24 ENCOUNTER — Other Ambulatory Visit: Payer: Self-pay | Admitting: Oncology

## 2020-06-24 MED ORDER — LISINOPRIL-HYDROCHLOROTHIAZIDE 20-12.5 MG PO TABS: 1.0000 | ORAL_TABLET | Freq: Every day | ORAL | 0 refills | Status: DC

## 2020-07-01 ENCOUNTER — Other Ambulatory Visit: Payer: Self-pay | Admitting: Oncology

## 2020-07-01 ENCOUNTER — Other Ambulatory Visit (HOSPITAL_COMMUNITY): Payer: Self-pay

## 2020-07-01 DIAGNOSIS — Z191 Hormone sensitive malignancy status: Secondary | ICD-10-CM

## 2020-07-01 MED ORDER — ABIRATERONE ACETATE 250 MG PO TABS
ORAL_TABLET | Freq: Every day | ORAL | 0 refills | Status: DC
  Filled 2020-07-01: qty 120, 30d supply, fill #0

## 2020-07-03 ENCOUNTER — Other Ambulatory Visit (HOSPITAL_COMMUNITY): Payer: Self-pay

## 2020-07-22 ENCOUNTER — Other Ambulatory Visit: Payer: Self-pay | Admitting: Oncology

## 2020-07-29 ENCOUNTER — Other Ambulatory Visit: Payer: Self-pay | Admitting: Oncology

## 2020-07-29 ENCOUNTER — Other Ambulatory Visit (HOSPITAL_COMMUNITY): Payer: Self-pay

## 2020-07-29 DIAGNOSIS — Z191 Hormone sensitive malignancy status: Secondary | ICD-10-CM

## 2020-07-29 DIAGNOSIS — C61 Malignant neoplasm of prostate: Secondary | ICD-10-CM

## 2020-07-29 MED ORDER — ABIRATERONE ACETATE 250 MG PO TABS
ORAL_TABLET | Freq: Every day | ORAL | 0 refills | Status: DC
  Filled 2020-07-29: qty 120, 30d supply, fill #0

## 2020-07-30 ENCOUNTER — Encounter
Admission: RE | Admit: 2020-07-30 | Discharge: 2020-07-30 | Disposition: A | Discharge status: Home / Self Care | Payer: Medicare Other | Source: Ambulatory Visit | Attending: Oncology | Admitting: Oncology

## 2020-07-30 ENCOUNTER — Ambulatory Visit
Admission: RE | Admit: 2020-07-30 | Discharge: 2020-07-30 | Disposition: A | Discharge status: Home / Self Care | Payer: Medicare Other | Source: Ambulatory Visit | Attending: Oncology | Admitting: Oncology

## 2020-07-30 ENCOUNTER — Other Ambulatory Visit: Payer: Self-pay

## 2020-07-30 ENCOUNTER — Other Ambulatory Visit (HOSPITAL_COMMUNITY): Payer: Self-pay

## 2020-07-30 DIAGNOSIS — Z191 Hormone sensitive malignancy status: Secondary | ICD-10-CM | POA: Diagnosis present

## 2020-07-30 DIAGNOSIS — C61 Malignant neoplasm of prostate: Secondary | ICD-10-CM | POA: Diagnosis present

## 2020-07-30 LAB — POCT I-STAT CREATININE: Creatinine, Ser: 1 mg/dL (ref 0.61–1.24)

## 2020-07-30 MED ORDER — IOHEXOL 300 MG/ML  SOLN
75.0000 mL | Freq: Once | INTRAMUSCULAR | Status: AC | PRN
  Administered 2020-07-30: 75 mL via INTRAVENOUS

## 2020-07-30 MED ORDER — TECHNETIUM TC 99M MEDRONATE IV KIT
20.0000 | PACK | Freq: Once | INTRAVENOUS | Status: AC | PRN
  Administered 2020-07-30: 21.17 via INTRAVENOUS

## 2020-08-04 ENCOUNTER — Inpatient Hospital Stay (HOSPITAL_BASED_OUTPATIENT_CLINIC_OR_DEPARTMENT_OTHER): Payer: Medicare Other | Admitting: Oncology

## 2020-08-04 ENCOUNTER — Inpatient Hospital Stay: Payer: Medicare Other

## 2020-08-04 ENCOUNTER — Inpatient Hospital Stay: Payer: Medicare Other | Attending: Oncology

## 2020-08-04 ENCOUNTER — Other Ambulatory Visit: Payer: Self-pay

## 2020-08-04 VITALS — BP 125/60 | HR 76 | Temp 97.1°F | Resp 18 | Wt 166.1 lb

## 2020-08-04 DIAGNOSIS — Z79899 Other long term (current) drug therapy: Secondary | ICD-10-CM

## 2020-08-04 DIAGNOSIS — C7951 Secondary malignant neoplasm of bone: Secondary | ICD-10-CM | POA: Insufficient documentation

## 2020-08-04 DIAGNOSIS — Z191 Hormone sensitive malignancy status: Secondary | ICD-10-CM

## 2020-08-04 DIAGNOSIS — Z5111 Encounter for antineoplastic chemotherapy: Secondary | ICD-10-CM | POA: Insufficient documentation

## 2020-08-04 DIAGNOSIS — C61 Malignant neoplasm of prostate: Secondary | ICD-10-CM

## 2020-08-04 DIAGNOSIS — R413 Other amnesia: Secondary | ICD-10-CM

## 2020-08-04 LAB — CBC WITH DIFFERENTIAL/PLATELET
Abs Immature Granulocytes: 0.04 10*3/uL (ref 0.00–0.07)
Basophils Absolute: 0 10*3/uL (ref 0.0–0.1)
Basophils Relative: 0 %
Eosinophils Absolute: 0.1 10*3/uL (ref 0.0–0.5)
Eosinophils Relative: 2 %
HCT: 32.1 % — ABNORMAL LOW (ref 39.0–52.0)
Hemoglobin: 10.9 g/dL — ABNORMAL LOW (ref 13.0–17.0)
Immature Granulocytes: 1 %
Lymphocytes Relative: 21 %
Lymphs Abs: 1.4 10*3/uL (ref 0.7–4.0)
MCH: 31.6 pg (ref 26.0–34.0)
MCHC: 34 g/dL (ref 30.0–36.0)
MCV: 93 fL (ref 80.0–100.0)
Monocytes Absolute: 1.3 10*3/uL — ABNORMAL HIGH (ref 0.1–1.0)
Monocytes Relative: 19 %
Neutro Abs: 3.8 10*3/uL (ref 1.7–7.7)
Neutrophils Relative %: 57 %
Platelets: 184 10*3/uL (ref 150–400)
RBC: 3.45 MIL/uL — ABNORMAL LOW (ref 4.22–5.81)
RDW: 14.6 % (ref 11.5–15.5)
WBC: 6.7 10*3/uL (ref 4.0–10.5)
nRBC: 0 % (ref 0.0–0.2)

## 2020-08-04 LAB — COMPREHENSIVE METABOLIC PANEL
ALT: 14 U/L (ref 0–44)
AST: 24 U/L (ref 15–41)
Albumin: 4.1 g/dL (ref 3.5–5.0)
Alkaline Phosphatase: 73 U/L (ref 38–126)
Anion gap: 11 (ref 5–15)
BUN: 28 mg/dL — ABNORMAL HIGH (ref 8–23)
CO2: 25 mmol/L (ref 22–32)
Calcium: 9.5 mg/dL (ref 8.9–10.3)
Chloride: 97 mmol/L — ABNORMAL LOW (ref 98–111)
Creatinine, Ser: 1.11 mg/dL (ref 0.61–1.24)
GFR, Estimated: >60 mL/min (ref >60)
Glucose, Bld: 105 mg/dL — ABNORMAL HIGH (ref 70–99)
Potassium: 3.9 mmol/L (ref 3.5–5.1)
Sodium: 133 mmol/L — ABNORMAL LOW (ref 135–145)
Total Bilirubin: 1.1 mg/dL (ref 0.3–1.2)
Total Protein: 7.5 g/dL (ref 6.5–8.1)

## 2020-08-04 LAB — PSA: Prostatic Specific Antigen: <0.01 ng/mL (ref 0.00–4.00)

## 2020-08-04 MED ORDER — LEUPROLIDE ACETATE (3 MONTH) 22.5 MG ~~LOC~~ KIT
22.5000 mg | PACK | SUBCUTANEOUS | Status: AC
  Administered 2020-08-04: 22.5 mg via SUBCUTANEOUS
  Filled 2020-08-04: qty 22.5

## 2020-08-04 NOTE — Progress Notes (Signed)
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 Posey Pronto for symptoms of left hip pain which prompted x-rays followed by MRI of the left hip without contrast. 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 Patient was subsequently seen by orthopedics Dr. Pathology who did not recommend any orthopedic intervention for the left hip fracture he has been referred to oncology for further management patient's last PSA was checked in 2014 which was elevated at 7.4. Patient is also a chronic smoker and smokes about 1 pack of cigarettes per day since 1968.  Head CT scan showed widespread bony metastatic disease with pathologic fracture of the left acetabulum. Pathologic fracture of the T3 vertebral body with 50% loss of height possible fracture of the left seventh rib.No evidence of visceral metastases. PSA elevated at 35.Bone biopsy consistent with prostate adenocarcinoma.  Unable to obtain NGS testing on bone sample  Patient received Mills Koller and is currently on Zytiga  Patient also seen at [facility] for second opinion related with [facility] and ADT.  They recommended bisphosphonates given pathologic fracture of the acetabulum  Interval history-patient is here with his wife today.  Wife is concerned that there was an episode when patient across the road and landed  up in a different building not knowing that he was lost.  Also reports several incidences of memory loss including not being able to recollect his dog's name.  Patient reports doing well and other than fatigue denies any other complaints at this time.  He is tolerating Zytiga well and has not missed any doses.  ECOG PS- 1 Pain scale- 0 Opioid associated constipation- no  Review of systems- ROS     Allergies  Allergen Reactions  . Citalopram Other (See Comments)    Unsure but family remembers that he could not take the dru-not sure what happened     Past Medical History:  Diagnosis Date  . Hypertension   . Prostate cancer ([place])      No past surgical history on file.  Social History   Socioeconomic History  . Marital status: Married    Spouse name: Not on file  . Number of children: Not on file  . Years of education: Not on file  . Highest education level: Not on file  Occupational History  . Not on file  Tobacco Use  . Smoking status: Former Smoker    Types: Pipe  . Smokeless tobacco: Former Systems developer  . Tobacco comment: stopped pipes 3 month ago  Vaping Use  . Vaping Use: Never used  Substance and Sexual Activity  . Alcohol use: Not Currently  . Drug use: Never  . Sexual activity: Not on file  Other Topics Concern  . Not on file  Social History Narrative  . Not on file   Social Determinants of Health   Financial Resource Strain: Not on file  Food Insecurity: Not on file  Transportation Needs: Not on file  Physical Activity: Not on  file  Stress: Not on file  Social Connections: Not on file  Intimate Partner Violence: Not on file    No family history on file.   Current Outpatient Medications:  .  abiraterone acetate (ZYTIGA) 250 MG tablet, TAKE 4 TABLETS (1,000 MG TOTAL) BY MOUTH DAILY. TAKE ON AN EMPTY STOMACH 1 HOUR BEFORE OR 2 HOURS AFTER A MEAL, Disp: 120 tablet, Rfl: 0 .  Cholecalciferol 125 MCG (5000 UT) capsule, Take 5,000 Units by mouth once a  week., Disp: , Rfl:  .  clonazePAM (KLONOPIN) 0.5 MG tablet, Take 1 tablet by mouth twice daily as needed, Disp: 60 tablet, Rfl: 0 .  lisinopril-hydrochlorothiazide (ZESTORETIC) 20-12.5 MG tablet, Take 1 tablet by mouth once daily, Disp: 30 tablet, Rfl: 0 .  predniSONE (DELTASONE) 5 MG tablet, Take 1 tablet (5 mg total) by mouth daily with breakfast., Disp: 30 tablet, Rfl: 3 No current facility-administered medications for this visit.  Facility-Administered Medications Ordered in Other Visits:  .  Leuprolide Acetate (3 Month) (ELIGARD) 22.5 MG injection 22.5 mg, 22.5 mg, Subcutaneous, Q90 days, Sindy Guadeloupe, MD, 22.5 mg at 05/06/20 1116 .  Leuprolide Acetate (3 Month) (ELIGARD) 22.5 MG injection 22.5 mg, 22.5 mg, Subcutaneous, Q90 days, Sindy Guadeloupe, MD, 22.5 mg at 08/04/20 1044  Physical exam:  Vitals:   08/04/20 0954  BP: 125/60  Pulse: 76  Resp: 18  Temp: (!) 97.1 F (36.2 C)  TempSrc: Tympanic  SpO2: 100%  Weight: 166 lb 1.6 oz (75.3 kg)   Physical Exam   CMP Latest Ref Rng & Units 08/04/2020  Glucose 70 - 99 mg/dL 105(H)  BUN 8 - 23 mg/dL 28(H)  Creatinine 0.61 - 1.24 mg/dL 1.11  Sodium 135 - 145 mmol/L 133(L)  Potassium 3.5 - 5.1 mmol/L 3.9  Chloride 98 - 111 mmol/L 97(L)  CO2 22 - 32 mmol/L 25  Calcium 8.9 - 10.3 mg/dL 9.5  Total Protein 6.5 - 8.1 g/dL 7.5  Total Bilirubin 0.3 - 1.2 mg/dL 1.1  Alkaline Phos 38 - 126 U/L 73  AST 15 - 41 U/L 24  ALT 0 - 44 U/L 14   CBC Latest Ref Rng & Units 08/04/2020  WBC 4.0 - 10.5 K/uL 6.7  Hemoglobin 13.0 - 17.0 g/dL 10.9(L)  Hematocrit 39.0 - 52.0 % 32.1(L)  Platelets 150 - 400 K/uL 184    No images are attached to the encounter.  NM Bone Scan Whole Body  Result Date: 08/01/2020 CLINICAL DATA:  Metastatic prostate cancer to bone EXAM: NUCLEAR MEDICINE WHOLE BODY BONE SCAN TECHNIQUE: Whole body anterior and posterior images were obtained approximately 3 hours after intravenous injection of radiopharmaceutical.  RADIOPHARMACEUTICALS:  21.17 mCi Technetium-46m MDP IV COMPARISON:  None Correlation: CT chest abdomen pelvis 07/30/2020, PET-CT 11/15/2019 FINDINGS: Foci of abnormal tracer uptake identified at ribs, sternum, and pelvis consistent with osseous metastases. Additional site of subtle increased tracer accumulation at the mid LEFT femoral diaphysis, corresponds to plate and screws present on PET-CT. Uptake at upper thoracic spine corresponding to compression fracture on CT. Uptake at shoulders, sternoclavicular joints, hips, knees, typically degenerative. Few foci of uptake in the lumbar spine correspond with degenerative changes on CT as well as a superior endplate compression deformity of L3. IMPRESSION: Uptake in ribs, pelvis, and sternum consistent with osseous metastases. Additional sites of degenerative, posttraumatic, and postsurgical uptake of tracer as above. Electronically Signed   By: Lavonia Dana M.D.   On: 08/01/2020 08:36   CT CHEST ABDOMEN PELVIS W CONTRAST  Result Date: 07/31/2020 CLINICAL DATA:  Metastatic prostate cancer EXAM: CT CHEST, ABDOMEN, AND PELVIS WITH CONTRAST TECHNIQUE: Multidetector CT imaging of the chest, abdomen and pelvis was performed following the standard protocol during bolus administration of intravenous contrast. CONTRAST:  54mL OMNIPAQUE IOHEXOL 300 MG/ML SOLN, additional oral enteric contrast COMPARISON:  None. FINDINGS: CT CHEST FINDINGS Cardiovascular: Aortic atherosclerosis. Normal heart size. Left coronary artery calcifications. No pericardial effusion. Mediastinum/Nodes: No enlarged mediastinal, hilar, or axillary lymph nodes. Thyroid gland, trachea, and esophagus demonstrate no significant findings. Lungs/Pleura: Minimal dependent bibasilar scarring and or atelectasis, unchanged. No pleural effusion or pneumothorax. Musculoskeletal: No chest wall mass. CT ABDOMEN PELVIS FINDINGS Hepatobiliary: No solid liver abnormality is seen. No gallstones, gallbladder wall  thickening, or biliary dilatation. Pancreas: Unremarkable. No pancreatic ductal dilatation or surrounding inflammatory changes. Spleen: Normal in size without significant abnormality. Adrenals/Urinary Tract: Adrenal glands are unremarkable. Small bilateral parapelvic cysts. Kidneys are otherwise normal, without renal calculi, solid lesion, or hydronephrosis. Bladder is unremarkable. Stomach/Bowel: Stomach is within normal limits. Appendix appears normal. No evidence of bowel wall thickening, distention, or inflammatory changes. Vascular/Lymphatic: Aortic atherosclerosis. No enlarged abdominal or pelvic lymph nodes. Reproductive: No mass or other abnormality. Other: Small fat containing bilateral inguinal hernias. No abdominopelvic ascites. Musculoskeletal: No acute osseous findings. Subtle lytic and sclerotic osseous lesions, for example of the inferior endplate of T9 (series 6, image 107). IMPRESSION: 1. Subtle lytic and sclerotic osseous lesions, consistent with known osseous metastatic disease. Please see separately reported nuclear scintigraphic bone scan for more sensitive assessment of osseous metastatic disease. 2. No evidence of lymphadenopathy or soft tissue metastatic disease in the chest, abdomen, or pelvis. 3. Coronary artery disease. Aortic Atherosclerosis (ICD10-I70.0). Electronically Signed   By: Eddie Candle M.D.   On: 07/31/2020 08:33     Assessment and plan- Patient is a 85 y.o. male with metastatic castrate sensitive prostate cancer with bone metastases currently on Zytiga here for routine follow-up  Patient is tolerating Zytiga well without any significant side effects.  I have reviewed recent CT chest abdomen pelvis images as well as bone scan which shows stable bone metastases and no evidence of recurrent or progressive disease.  PSA from today is pending but the value from February 2022 was 0.02Which was significantly lower as compared to 35 9 months ago.  Plan is to continue Lupron with  Zytiga until progression or toxicity.  We had discussed adding Zometa and castrate sensitive setting due to his pathologic hip fracture but patient wishes to hold off on that at this time.  I will see him back in 3 months with CBC with differential CMP and PSA for his next dose of Lupron  I did offer referral to neurology for his symptoms of ongoing memory loss but he did not wish to proceed with that   Visit Diagnosis 1. High risk medication use   2. Bone metastases ([place])   3. Prostate cancer metastatic to bone ([place])   4. Memory loss      Dr. Randa Evens, MD, MPH [facility] [phone] 08/04/2020 12:43 PM

## 2020-08-07 ENCOUNTER — Other Ambulatory Visit (HOSPITAL_COMMUNITY): Payer: Self-pay

## 2020-08-24 ENCOUNTER — Other Ambulatory Visit: Payer: Self-pay | Admitting: Oncology

## 2020-08-25 ENCOUNTER — Encounter: Payer: Self-pay | Admitting: Oncology

## 2020-08-27 ENCOUNTER — Other Ambulatory Visit: Payer: Self-pay | Admitting: Oncology

## 2020-08-27 ENCOUNTER — Other Ambulatory Visit (HOSPITAL_COMMUNITY): Payer: Self-pay

## 2020-08-27 DIAGNOSIS — C61 Malignant neoplasm of prostate: Secondary | ICD-10-CM

## 2020-08-27 DIAGNOSIS — Z191 Hormone sensitive malignancy status: Secondary | ICD-10-CM

## 2020-08-27 MED ORDER — ABIRATERONE ACETATE 250 MG PO TABS
ORAL_TABLET | Freq: Every day | ORAL | 0 refills | Status: DC
  Filled 2020-08-27: qty 120, 30d supply, fill #0

## 2020-09-02 ENCOUNTER — Other Ambulatory Visit (HOSPITAL_COMMUNITY): Payer: Self-pay

## 2020-09-02 ENCOUNTER — Encounter: Payer: Self-pay | Admitting: Oncology

## 2020-09-05 ENCOUNTER — Encounter: Payer: Self-pay | Admitting: Oncology

## 2020-09-22 ENCOUNTER — Other Ambulatory Visit: Payer: Self-pay | Admitting: Nurse Practitioner

## 2020-09-24 ENCOUNTER — Other Ambulatory Visit: Payer: Self-pay | Admitting: Oncology

## 2020-09-24 ENCOUNTER — Encounter: Payer: Self-pay | Admitting: Oncology

## 2020-09-24 ENCOUNTER — Other Ambulatory Visit (HOSPITAL_COMMUNITY): Payer: Self-pay

## 2020-09-24 DIAGNOSIS — Z191 Hormone sensitive malignancy status: Secondary | ICD-10-CM

## 2020-09-26 ENCOUNTER — Other Ambulatory Visit (HOSPITAL_COMMUNITY): Payer: Self-pay

## 2020-09-26 ENCOUNTER — Other Ambulatory Visit: Payer: Self-pay | Admitting: Oncology

## 2020-09-26 DIAGNOSIS — C61 Malignant neoplasm of prostate: Secondary | ICD-10-CM

## 2020-09-26 DIAGNOSIS — Z191 Hormone sensitive malignancy status: Secondary | ICD-10-CM

## 2020-09-29 ENCOUNTER — Other Ambulatory Visit (HOSPITAL_COMMUNITY): Payer: Self-pay

## 2020-09-29 ENCOUNTER — Other Ambulatory Visit: Payer: Self-pay | Admitting: Oncology

## 2020-09-29 DIAGNOSIS — Z191 Hormone sensitive malignancy status: Secondary | ICD-10-CM

## 2020-10-01 ENCOUNTER — Other Ambulatory Visit (HOSPITAL_COMMUNITY): Payer: Self-pay

## 2020-10-03 ENCOUNTER — Other Ambulatory Visit: Payer: Self-pay | Admitting: Oncology

## 2020-10-03 ENCOUNTER — Other Ambulatory Visit (HOSPITAL_COMMUNITY): Payer: Self-pay

## 2020-10-03 DIAGNOSIS — Z191 Hormone sensitive malignancy status: Secondary | ICD-10-CM

## 2020-10-07 ENCOUNTER — Other Ambulatory Visit: Payer: Self-pay | Admitting: Oncology

## 2020-10-07 ENCOUNTER — Other Ambulatory Visit (HOSPITAL_COMMUNITY): Payer: Self-pay

## 2020-10-07 DIAGNOSIS — Z191 Hormone sensitive malignancy status: Secondary | ICD-10-CM

## 2020-10-10 ENCOUNTER — Other Ambulatory Visit: Payer: Self-pay | Admitting: Pharmacist

## 2020-10-10 ENCOUNTER — Other Ambulatory Visit (HOSPITAL_COMMUNITY): Payer: Self-pay

## 2020-10-10 DIAGNOSIS — C61 Malignant neoplasm of prostate: Secondary | ICD-10-CM

## 2020-10-10 DIAGNOSIS — Z191 Hormone sensitive malignancy status: Secondary | ICD-10-CM

## 2020-10-10 MED ORDER — ABIRATERONE ACETATE 250 MG PO TABS
ORAL_TABLET | Freq: Every day | ORAL | 0 refills | Status: DC
  Filled 2020-10-10: qty 120, 30d supply, fill #0

## 2020-10-27 ENCOUNTER — Encounter: Payer: Self-pay | Admitting: Oncology

## 2020-10-27 NOTE — Telephone Encounter (Signed)
Continue prn oxycodone. When he comes for lupron on 8/16- lets check cbc cmp and psa. Based on psa we can decide if we need scans sooner

## 2020-10-28 ENCOUNTER — Encounter: Payer: Self-pay | Admitting: Oncology

## 2020-10-28 ENCOUNTER — Other Ambulatory Visit (HOSPITAL_COMMUNITY): Payer: Self-pay

## 2020-10-28 ENCOUNTER — Other Ambulatory Visit: Payer: Self-pay | Admitting: Pharmacist

## 2020-10-28 DIAGNOSIS — Z191 Hormone sensitive malignancy status: Secondary | ICD-10-CM

## 2020-10-28 DIAGNOSIS — C61 Malignant neoplasm of prostate: Secondary | ICD-10-CM

## 2020-10-28 MED ORDER — ABIRATERONE ACETATE 250 MG PO TABS
ORAL_TABLET | Freq: Every day | ORAL | 0 refills | Status: DC
  Filled 2020-11-06: qty 120, 30d supply, fill #0

## 2020-10-29 ENCOUNTER — Inpatient Hospital Stay: Payer: Medicare Other | Attending: Oncology

## 2020-10-29 DIAGNOSIS — Z5111 Encounter for antineoplastic chemotherapy: Secondary | ICD-10-CM | POA: Diagnosis present

## 2020-10-29 DIAGNOSIS — C7951 Secondary malignant neoplasm of bone: Secondary | ICD-10-CM | POA: Insufficient documentation

## 2020-10-29 DIAGNOSIS — C61 Malignant neoplasm of prostate: Secondary | ICD-10-CM | POA: Insufficient documentation

## 2020-10-29 DIAGNOSIS — Z191 Hormone sensitive malignancy status: Secondary | ICD-10-CM

## 2020-10-29 LAB — COMPREHENSIVE METABOLIC PANEL
ALT: 12 U/L (ref 0–44)
AST: 23 U/L (ref 15–41)
Albumin: 4.4 g/dL (ref 3.5–5.0)
Alkaline Phosphatase: 69 U/L (ref 38–126)
Anion gap: 9 (ref 5–15)
BUN: 32 mg/dL — ABNORMAL HIGH (ref 8–23)
CO2: 26 mmol/L (ref 22–32)
Calcium: 9.3 mg/dL (ref 8.9–10.3)
Chloride: 95 mmol/L — ABNORMAL LOW (ref 98–111)
Creatinine, Ser: 1.26 mg/dL — ABNORMAL HIGH (ref 0.61–1.24)
GFR, Estimated: 57 mL/min — ABNORMAL LOW (ref >60)
Glucose, Bld: 114 mg/dL — ABNORMAL HIGH (ref 70–99)
Potassium: 4.4 mmol/L (ref 3.5–5.1)
Sodium: 130 mmol/L — ABNORMAL LOW (ref 135–145)
Total Bilirubin: 1 mg/dL (ref 0.3–1.2)
Total Protein: 7.6 g/dL (ref 6.5–8.1)

## 2020-10-29 LAB — CBC WITH DIFFERENTIAL/PLATELET
Abs Immature Granulocytes: 0.03 10*3/uL (ref 0.00–0.07)
Basophils Absolute: 0 10*3/uL (ref 0.0–0.1)
Basophils Relative: 0 %
Eosinophils Absolute: 0 10*3/uL (ref 0.0–0.5)
Eosinophils Relative: 1 %
HCT: 31.4 % — ABNORMAL LOW (ref 39.0–52.0)
Hemoglobin: 10.5 g/dL — ABNORMAL LOW (ref 13.0–17.0)
Immature Granulocytes: 1 %
Lymphocytes Relative: 13 %
Lymphs Abs: 0.8 10*3/uL (ref 0.7–4.0)
MCH: 31.2 pg (ref 26.0–34.0)
MCHC: 33.4 g/dL (ref 30.0–36.0)
MCV: 93.2 fL (ref 80.0–100.0)
Monocytes Absolute: 0.4 10*3/uL (ref 0.1–1.0)
Monocytes Relative: 7 %
Neutro Abs: 5.2 10*3/uL (ref 1.7–7.7)
Neutrophils Relative %: 78 %
Platelets: 190 10*3/uL (ref 150–400)
RBC: 3.37 MIL/uL — ABNORMAL LOW (ref 4.22–5.81)
RDW: 14.4 % (ref 11.5–15.5)
WBC: 6.5 10*3/uL (ref 4.0–10.5)
nRBC: 0 % (ref 0.0–0.2)

## 2020-10-29 LAB — PSA: Prostatic Specific Antigen: <0.01 ng/mL (ref 0.00–4.00)

## 2020-10-30 ENCOUNTER — Other Ambulatory Visit (HOSPITAL_COMMUNITY): Payer: Self-pay

## 2020-11-04 ENCOUNTER — Inpatient Hospital Stay: Payer: Medicare Other

## 2020-11-04 DIAGNOSIS — Z5111 Encounter for antineoplastic chemotherapy: Secondary | ICD-10-CM | POA: Diagnosis not present

## 2020-11-04 DIAGNOSIS — C61 Malignant neoplasm of prostate: Secondary | ICD-10-CM

## 2020-11-04 MED ORDER — LEUPROLIDE ACETATE (6 MONTH) 45 MG ~~LOC~~ KIT
45.0000 mg | PACK | Freq: Once | SUBCUTANEOUS | Status: AC
  Administered 2020-11-04: 45 mg via SUBCUTANEOUS
  Filled 2020-11-04: qty 45

## 2020-11-06 ENCOUNTER — Other Ambulatory Visit (HOSPITAL_COMMUNITY): Payer: Self-pay

## 2020-11-06 ENCOUNTER — Other Ambulatory Visit: Payer: Self-pay | Admitting: Neurology

## 2020-11-06 DIAGNOSIS — F0280 Dementia in other diseases classified elsewhere without behavioral disturbance: Secondary | ICD-10-CM

## 2020-11-20 ENCOUNTER — Ambulatory Visit
Admission: RE | Admit: 2020-11-20 | Discharge: 2020-11-20 | Disposition: A | Discharge status: Home / Self Care | Payer: Medicare Other | Source: Ambulatory Visit | Attending: Neurology | Admitting: Neurology

## 2020-11-20 ENCOUNTER — Other Ambulatory Visit: Payer: Self-pay

## 2020-11-20 DIAGNOSIS — F0280 Dementia in other diseases classified elsewhere without behavioral disturbance: Secondary | ICD-10-CM | POA: Insufficient documentation

## 2020-11-20 DIAGNOSIS — G309 Alzheimer's disease, unspecified: Secondary | ICD-10-CM | POA: Insufficient documentation

## 2020-11-20 MED ORDER — GADOBUTROL 1 MMOL/ML IV SOLN
7.0000 mL | Freq: Once | INTRAVENOUS | Status: AC | PRN
  Administered 2020-11-20: 7 mL via INTRAVENOUS

## 2020-11-27 ENCOUNTER — Other Ambulatory Visit (HOSPITAL_COMMUNITY): Payer: Self-pay

## 2020-11-27 ENCOUNTER — Other Ambulatory Visit: Payer: Self-pay | Admitting: Pharmacist

## 2020-11-27 DIAGNOSIS — C61 Malignant neoplasm of prostate: Secondary | ICD-10-CM

## 2020-11-27 DIAGNOSIS — Z191 Hormone sensitive malignancy status: Secondary | ICD-10-CM

## 2020-11-28 ENCOUNTER — Other Ambulatory Visit (HOSPITAL_COMMUNITY): Payer: Self-pay

## 2020-11-29 ENCOUNTER — Other Ambulatory Visit (HOSPITAL_COMMUNITY): Payer: Self-pay

## 2020-12-03 ENCOUNTER — Other Ambulatory Visit (HOSPITAL_COMMUNITY): Payer: Self-pay

## 2020-12-09 ENCOUNTER — Other Ambulatory Visit (HOSPITAL_COMMUNITY): Payer: Self-pay

## 2020-12-09 MED ORDER — ABIRATERONE ACETATE 250 MG PO TABS
ORAL_TABLET | Freq: Every day | ORAL | 0 refills | Status: DC
  Filled 2020-12-09: qty 120, fill #0
  Filled 2020-12-10 (×2): qty 120, 30d supply, fill #0

## 2020-12-10 ENCOUNTER — Telehealth: Payer: Self-pay | Admitting: Pharmacy Technician

## 2020-12-10 ENCOUNTER — Encounter: Payer: Self-pay | Admitting: Oncology

## 2020-12-10 ENCOUNTER — Other Ambulatory Visit (HOSPITAL_COMMUNITY): Payer: Self-pay

## 2020-12-10 NOTE — Telephone Encounter (Signed)
Oral Oncology Patient Advocate Encounter   Was successful in securing patient a $58 grant from Patient [place] (PAF) to provide copayment coverage for Zytiga.  This will keep the out of pocket expense at $0.     I have spoken with the patient's wife.    The billing information is as follows and has been shared with [place].   RxBin: [id number] PCN:  [id number] Member ID: [id number] Group ID: [id number] Dates of Eligibility: 12/10/20 through 12/10/21  Fund: Metastatic Prostate [place] Patient [place] Phone [phone] Fax [phone] 12/10/2020 1:55 PM

## 2020-12-18 ENCOUNTER — Other Ambulatory Visit: Payer: Self-pay | Admitting: Oncology

## 2020-12-18 DIAGNOSIS — Z191 Hormone sensitive malignancy status: Secondary | ICD-10-CM

## 2020-12-22 ENCOUNTER — Other Ambulatory Visit: Payer: Self-pay | Admitting: Oncology

## 2020-12-22 DIAGNOSIS — Z191 Hormone sensitive malignancy status: Secondary | ICD-10-CM

## 2020-12-24 ENCOUNTER — Encounter: Payer: Self-pay | Admitting: Oncology

## 2020-12-31 ENCOUNTER — Other Ambulatory Visit (HOSPITAL_COMMUNITY): Payer: Self-pay

## 2020-12-31 ENCOUNTER — Other Ambulatory Visit: Payer: Self-pay | Admitting: Pharmacist

## 2020-12-31 DIAGNOSIS — C61 Malignant neoplasm of prostate: Secondary | ICD-10-CM

## 2020-12-31 MED ORDER — ABIRATERONE ACETATE 250 MG PO TABS
ORAL_TABLET | Freq: Every day | ORAL | 1 refills | Status: DC
  Filled 2020-12-31: qty 120, 30d supply, fill #0
  Filled 2021-01-28: qty 120, 30d supply, fill #1

## 2021-01-05 ENCOUNTER — Other Ambulatory Visit (HOSPITAL_COMMUNITY): Payer: Self-pay

## 2021-01-09 ENCOUNTER — Telehealth: Payer: Self-pay | Admitting: Pharmacy Technician

## 2021-01-09 NOTE — Telephone Encounter (Signed)
Oral Oncology Patient Advocate Encounter  Was successful in securing patient a $8,000 grant from [employer] to provide copayment coverage for Zytiga.  This will keep the out of pocket expense at $0.     [employer] ID: [id number]   The billing information is as follows and has been shared with [place].    RxBin: [id number] PCN: [id number] Member ID: [id number] Group ID: [id number] Dates of Eligibility: 12/08/20 through 12/07/21  Fund:  [place] Patient [place] Phone [phone] Fax [phone] 01/09/2021 12:36 PM

## 2021-01-12 ENCOUNTER — Encounter: Payer: Self-pay | Admitting: Oncology

## 2021-01-12 ENCOUNTER — Other Ambulatory Visit (HOSPITAL_COMMUNITY): Payer: Self-pay

## 2021-01-23 ENCOUNTER — Telehealth: Payer: Self-pay | Admitting: Pharmacy Technician

## 2021-01-23 NOTE — Telephone Encounter (Signed)
Oral Oncology Patient Advocate Encounter   Was successful in securing patient an [phone] grant from Patient [place] [facility]) to provide copayment coverage for Zytiga.  This will keep the out of pocket expense at $0.     I have spoken with the patient.    The billing information is as follows and has been shared with [place].   Member ID: [id number] Group ID: [id number] RxBin: [id number] Dates of Eligibility: 10/25/20 through 01/22/22  Fund:  [place] Patient [place] Phone [phone] Fax [phone] 01/23/2021 2:12 PM

## 2021-01-26 ENCOUNTER — Encounter: Payer: Self-pay | Admitting: Oncology

## 2021-01-26 ENCOUNTER — Other Ambulatory Visit (HOSPITAL_COMMUNITY): Payer: Self-pay

## 2021-01-27 ENCOUNTER — Other Ambulatory Visit (HOSPITAL_COMMUNITY): Payer: Self-pay

## 2021-01-28 ENCOUNTER — Other Ambulatory Visit (HOSPITAL_COMMUNITY): Payer: Self-pay

## 2021-02-04 ENCOUNTER — Other Ambulatory Visit: Payer: Self-pay | Admitting: *Deleted

## 2021-02-04 DIAGNOSIS — C61 Malignant neoplasm of prostate: Secondary | ICD-10-CM

## 2021-02-04 DIAGNOSIS — Z191 Hormone sensitive malignancy status: Secondary | ICD-10-CM

## 2021-02-05 ENCOUNTER — Other Ambulatory Visit (HOSPITAL_COMMUNITY): Payer: Self-pay

## 2021-02-06 ENCOUNTER — Inpatient Hospital Stay (HOSPITAL_BASED_OUTPATIENT_CLINIC_OR_DEPARTMENT_OTHER): Payer: Medicare Other | Admitting: Oncology

## 2021-02-06 ENCOUNTER — Encounter: Payer: Self-pay | Admitting: Oncology

## 2021-02-06 ENCOUNTER — Inpatient Hospital Stay: Payer: Medicare Other | Attending: Oncology

## 2021-02-06 ENCOUNTER — Telehealth: Payer: Medicare Other | Admitting: Oncology

## 2021-02-06 ENCOUNTER — Other Ambulatory Visit: Payer: Self-pay

## 2021-02-06 ENCOUNTER — Other Ambulatory Visit: Payer: Medicare Other

## 2021-02-06 VITALS — BP 160/70 | HR 77 | Temp 97.8°F | Resp 20 | Wt 166.4 lb

## 2021-02-06 DIAGNOSIS — Z79899 Other long term (current) drug therapy: Secondary | ICD-10-CM | POA: Insufficient documentation

## 2021-02-06 DIAGNOSIS — C7951 Secondary malignant neoplasm of bone: Secondary | ICD-10-CM | POA: Insufficient documentation

## 2021-02-06 DIAGNOSIS — Z191 Hormone sensitive malignancy status: Secondary | ICD-10-CM

## 2021-02-06 DIAGNOSIS — C61 Malignant neoplasm of prostate: Secondary | ICD-10-CM | POA: Insufficient documentation

## 2021-02-06 LAB — CBC WITH DIFFERENTIAL/PLATELET
Abs Immature Granulocytes: 0.05 10*3/uL (ref 0.00–0.07)
Basophils Absolute: 0 10*3/uL (ref 0.0–0.1)
Basophils Relative: 0 %
Eosinophils Absolute: 0.1 10*3/uL (ref 0.0–0.5)
Eosinophils Relative: 1 %
HCT: 32.6 % — ABNORMAL LOW (ref 39.0–52.0)
Hemoglobin: 11 g/dL — ABNORMAL LOW (ref 13.0–17.0)
Immature Granulocytes: 1 %
Lymphocytes Relative: 21 %
Lymphs Abs: 1.5 10*3/uL (ref 0.7–4.0)
MCH: 30.9 pg (ref 26.0–34.0)
MCHC: 33.7 g/dL (ref 30.0–36.0)
MCV: 91.6 fL (ref 80.0–100.0)
Monocytes Absolute: 1.3 10*3/uL — ABNORMAL HIGH (ref 0.1–1.0)
Monocytes Relative: 19 %
Neutro Abs: 4.1 10*3/uL (ref 1.7–7.7)
Neutrophils Relative %: 58 %
Platelets: 191 10*3/uL (ref 150–400)
RBC: 3.56 MIL/uL — ABNORMAL LOW (ref 4.22–5.81)
RDW: 14.1 % (ref 11.5–15.5)
WBC: 7.1 10*3/uL (ref 4.0–10.5)
nRBC: 0 % (ref 0.0–0.2)

## 2021-02-06 LAB — COMPREHENSIVE METABOLIC PANEL
ALT: 14 U/L (ref 0–44)
AST: 25 U/L (ref 15–41)
Albumin: 4.2 g/dL (ref 3.5–5.0)
Alkaline Phosphatase: 77 U/L (ref 38–126)
Anion gap: 10 (ref 5–15)
BUN: 27 mg/dL — ABNORMAL HIGH (ref 8–23)
CO2: 25 mmol/L (ref 22–32)
Calcium: 9.2 mg/dL (ref 8.9–10.3)
Chloride: 99 mmol/L (ref 98–111)
Creatinine, Ser: 1.15 mg/dL (ref 0.61–1.24)
GFR, Estimated: >60 mL/min (ref >60)
Glucose, Bld: 109 mg/dL — ABNORMAL HIGH (ref 70–99)
Potassium: 3.7 mmol/L (ref 3.5–5.1)
Sodium: 134 mmol/L — ABNORMAL LOW (ref 135–145)
Total Bilirubin: 0.5 mg/dL (ref 0.3–1.2)
Total Protein: 7.7 g/dL (ref 6.5–8.1)

## 2021-02-06 LAB — PSA: Prostatic Specific Antigen: <0.01 ng/mL (ref 0.00–4.00)

## 2021-02-06 NOTE — Progress Notes (Signed)
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 Posey Pronto for symptoms of left hip pain which prompted x-rays followed by MRI of the left hip without contrast.  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  Patient was subsequently seen by orthopedics Dr. Pathology who did not recommend any orthopedic intervention for the left hip fracture he has been referred to oncology for further management patient's last PSA was checked in 2014 which was elevated at 7.4.  Patient is also a chronic smoker and smokes about 1 pack of cigarettes per day since 1968.   Head CT scan showed widespread bony metastatic disease with pathologic fracture of the left acetabulum.  Pathologic fracture of the T3 vertebral body with 50% loss of height possible fracture of the left seventh rib.  No evidence of visceral metastases.  PSA elevated at 35.  Bone biopsy consistent with prostate adenocarcinoma.  Unable to obtain NGS testing on bone sample   Patient received Mills Koller and is currently on Zytiga   Patient also seen at [facility] for second opinion related with [facility] and [facility].  They recommended bisphosphonates given pathologic fracture of the acetabulum which patient declined  Interval history-overall tolerating Zytiga  well without any significant side effects.  He has occasional low back pain and right leg pain for which she uses as needed oxycodone.  He uses occasional clonazepam for insomnia.  ECOG PS- 1 Pain scale- 3 Opioid associated constipation- no  Review of systems- Review of Systems  Constitutional:  Positive for malaise/fatigue. Negative for chills, fever and weight loss.  HENT:  Negative for congestion, ear discharge and nosebleeds.   Eyes:  Negative for blurred vision.  Respiratory:  Negative for cough, hemoptysis, sputum production, shortness of breath and wheezing.   Cardiovascular:  Negative for chest pain, palpitations, orthopnea and claudication.  Gastrointestinal:  Negative for abdominal pain, blood in stool, constipation, diarrhea, heartburn, melena, nausea and vomiting.  Genitourinary:  Negative for dysuria, flank pain, frequency, hematuria and urgency.  Musculoskeletal:  Negative for back pain, joint pain and myalgias.  Skin:  Negative for rash.  Neurological:  Negative for dizziness, tingling, focal weakness, seizures, weakness and headaches.  Endo/Heme/Allergies:  Does not bruise/bleed easily.  Psychiatric/Behavioral:  Negative for depression and suicidal ideas. The patient does not have insomnia.       Allergies  Allergen Reactions   Citalopram Other (See Comments)    Unsure but family remembers that he could not take the dru-not sure what happened     Past Medical History:  Diagnosis Date   Hypertension    Prostate cancer ([place])      History reviewed. No pertinent surgical history.  Social History   Socioeconomic History   Marital status: Married    Spouse name: Not on file   Number of children: Not on file   Years of education: Not on file   Highest  education level: Not on file  Occupational History   Not on file  Tobacco Use   Smoking status: Former    Types: Pipe   Smokeless tobacco: Former   Tobacco comments:    stopped pipes 3 month ago  Vaping Use    Vaping Use: Never used  Substance and Sexual Activity   Alcohol use: Not Currently   Drug use: Never   Sexual activity: Not on file  Other Topics Concern   Not on file  Social History Narrative   Not on file   Social Determinants of Health   Financial Resource Strain: Not on file  Food Insecurity: Not on file  Transportation Needs: Not on file  Physical Activity: Not on file  Stress: Not on file  Social Connections: Not on file  Intimate Partner Violence: Not on file    History reviewed. No pertinent family history.   Current Outpatient Medications:    abiraterone acetate (ZYTIGA) 250 MG tablet, TAKE 4 TABLETS (1,000 MG TOTAL) BY MOUTH DAILY. TAKE ON AN EMPTY STOMACH 1 HOUR BEFORE OR 2 HOURS AFTER A MEAL, Disp: 120 tablet, Rfl: 1   Cholecalciferol 125 MCG (5000 UT) capsule, Take 5,000 Units by mouth once a week., Disp: , Rfl:    cyanocobalamin 1000 MCG tablet, Take by mouth., Disp: , Rfl:    Docusate Sodium (DSS) 100 MG CAPS, Take by mouth., Disp: , Rfl:    donepezil (ARICEPT) 5 MG tablet, Take by mouth., Disp: , Rfl:    lisinopril-hydrochlorothiazide (ZESTORETIC) 20-12.5 MG tablet, Take 1 tablet by mouth once daily, Disp: 30 tablet, Rfl: 0   predniSONE (DELTASONE) 5 MG tablet, Take 1 tablet by mouth once daily with breakfast, Disp: 90 tablet, Rfl: 0   senna (SENOKOT) 8.6 MG tablet, Take by mouth., Disp: , Rfl:    traZODone (DESYREL) 50 MG tablet, Take 50 mg by mouth at bedtime as needed., Disp: , Rfl:    clonazePAM (KLONOPIN) 0.5 MG tablet, Take 1 tablet by mouth twice daily as needed, Disp: 60 tablet, Rfl: 0 No current facility-administered medications for this visit.  Facility-Administered Medications Ordered in Other Visits:    Leuprolide Acetate (3 Month) (ELIGARD) 22.5 MG injection 22.5 mg, 22.5 mg, Subcutaneous, Q90 days, Sindy Guadeloupe, MD, 22.5 mg at 05/06/20 1116   Leuprolide Acetate (3 Month) (ELIGARD) 22.5 MG injection 22.5 mg, 22.5 mg, Subcutaneous, Q90 days,  Sindy Guadeloupe, MD, 22.5 mg at 08/04/20 1044  Physical exam:  Vitals:   02/06/21 0952  BP: (!) 160/70  Pulse: 77  Resp: 20  Temp: 97.8 F (36.6 C)  SpO2: 100%  Weight: 166 lb 6.4 oz (75.5 kg)   Physical Exam Constitutional:      General: He is not in acute distress. Cardiovascular:     Rate and Rhythm: Normal rate and regular rhythm.     Heart sounds: Normal heart sounds.  Pulmonary:     Effort: Pulmonary effort is normal.     Breath sounds: Normal breath sounds.  Abdominal:     General: Bowel sounds are normal.     Palpations: Abdomen is soft.  Musculoskeletal:     Right lower leg: No edema.     Left lower leg: No edema.  Skin:    General: Skin is warm and dry.  Neurological:     Mental Status: He is alert and oriented to person, place, and time.     CMP Latest Ref Rng & Units 02/06/2021  Glucose 70 - 99 mg/dL  109(H)  BUN 8 - 23 mg/dL 27(H)  Creatinine 0.61 - 1.24 mg/dL 1.15  Sodium 135 - 145 mmol/L 134(L)  Potassium 3.5 - 5.1 mmol/L 3.7  Chloride 98 - 111 mmol/L 99  CO2 22 - 32 mmol/L 25  Calcium 8.9 - 10.3 mg/dL 9.2  Total Protein 6.5 - 8.1 g/dL 7.7  Total Bilirubin 0.3 - 1.2 mg/dL 0.5  Alkaline Phos 38 - 126 U/L 77  AST 15 - 41 U/L 25  ALT 0 - 44 U/L 14   CBC Latest Ref Rng & Units 02/06/2021  WBC 4.0 - 10.5 K/uL 7.1  Hemoglobin 13.0 - 17.0 g/dL 11.0(L)  Hematocrit 39.0 - 52.0 % 32.6(L)  Platelets 150 - 400 K/uL 191    Assessment and plan- Patient is a 85 y.o. male with metastatic castrate sensitive prostate cancer with bone metastases on Zytiga here for routine follow-up  PSA from today is pending.  Prior PSA continues to be undetectable.  Overall patient has responded well to Eligard plus Zytiga he will continue that until progression or toxicity.  I will see him back in mid February when he will be due for his next Zytiga dose and I will obtain CT chest abdomen pelvis with contrast prior.  We will also discuss getting bone density scans and considering  use of prophylactic bisphosphonates if he has any evidence of osteopenia or osteoporosis   Visit Diagnosis 1. Prostate cancer metastatic to bone ([place])   2. High risk medication use      Dr. Randa Evens, MD, MPH [facility] at [facility] [phone] 02/06/2021 11:26 AM

## 2021-02-06 NOTE — Progress Notes (Signed)
Patient states he is having discomfort in right leg going into the hip. Patient states when he stands up his head "gets full and dizzy".

## 2021-02-10 ENCOUNTER — Other Ambulatory Visit: Payer: Self-pay | Admitting: *Deleted

## 2021-02-10 ENCOUNTER — Other Ambulatory Visit (HOSPITAL_COMMUNITY): Payer: Self-pay

## 2021-02-10 DIAGNOSIS — Z191 Hormone sensitive malignancy status: Secondary | ICD-10-CM

## 2021-02-10 DIAGNOSIS — C61 Malignant neoplasm of prostate: Secondary | ICD-10-CM

## 2021-02-10 MED ORDER — ABIRATERONE ACETATE 250 MG PO TABS
ORAL_TABLET | Freq: Every day | ORAL | 3 refills | Status: DC
  Filled 2021-02-10: qty 120, fill #0
  Filled 2021-02-24: qty 120, 30d supply, fill #0
  Filled 2021-03-27: qty 120, 30d supply, fill #1
  Filled 2021-04-29: qty 120, 30d supply, fill #2
  Filled 2021-06-04: qty 120, 30d supply, fill #3

## 2021-02-24 ENCOUNTER — Other Ambulatory Visit (HOSPITAL_COMMUNITY): Payer: Self-pay

## 2021-02-26 ENCOUNTER — Other Ambulatory Visit (HOSPITAL_COMMUNITY): Payer: Self-pay

## 2021-03-05 ENCOUNTER — Other Ambulatory Visit (HOSPITAL_COMMUNITY): Payer: Self-pay

## 2021-03-27 ENCOUNTER — Other Ambulatory Visit (HOSPITAL_COMMUNITY): Payer: Self-pay

## 2021-04-01 ENCOUNTER — Other Ambulatory Visit (HOSPITAL_COMMUNITY): Payer: Self-pay

## 2021-04-06 ENCOUNTER — Other Ambulatory Visit (HOSPITAL_COMMUNITY): Payer: Self-pay

## 2021-04-09 ENCOUNTER — Other Ambulatory Visit (HOSPITAL_COMMUNITY): Payer: Self-pay

## 2021-04-13 ENCOUNTER — Other Ambulatory Visit (HOSPITAL_COMMUNITY): Payer: Self-pay

## 2021-04-29 ENCOUNTER — Other Ambulatory Visit (HOSPITAL_COMMUNITY): Payer: Self-pay

## 2021-04-30 ENCOUNTER — Ambulatory Visit
Admission: RE | Admit: 2021-04-30 | Discharge: 2021-04-30 | Disposition: A | Discharge status: Home / Self Care | Payer: Medicare Other | Source: Ambulatory Visit | Attending: Oncology | Admitting: Oncology

## 2021-04-30 ENCOUNTER — Other Ambulatory Visit: Payer: Self-pay

## 2021-04-30 ENCOUNTER — Inpatient Hospital Stay: Payer: Medicare Other | Attending: Oncology

## 2021-04-30 ENCOUNTER — Encounter
Admission: RE | Admit: 2021-04-30 | Discharge: 2021-04-30 | Disposition: A | Discharge status: Home / Self Care | Payer: Medicare Other | Source: Ambulatory Visit | Attending: Oncology | Admitting: Oncology

## 2021-04-30 DIAGNOSIS — Z5111 Encounter for antineoplastic chemotherapy: Secondary | ICD-10-CM | POA: Diagnosis present

## 2021-04-30 DIAGNOSIS — C7951 Secondary malignant neoplasm of bone: Secondary | ICD-10-CM

## 2021-04-30 DIAGNOSIS — C61 Malignant neoplasm of prostate: Secondary | ICD-10-CM

## 2021-04-30 LAB — CBC WITH DIFFERENTIAL/PLATELET
Abs Immature Granulocytes: 0.03 10*3/uL (ref 0.00–0.07)
Basophils Absolute: 0 10*3/uL (ref 0.0–0.1)
Basophils Relative: 0 %
Eosinophils Absolute: 0.1 10*3/uL (ref 0.0–0.5)
Eosinophils Relative: 2 %
HCT: 29.9 % — ABNORMAL LOW (ref 39.0–52.0)
Hemoglobin: 10.1 g/dL — ABNORMAL LOW (ref 13.0–17.0)
Immature Granulocytes: 1 %
Lymphocytes Relative: 21 %
Lymphs Abs: 1.3 10*3/uL (ref 0.7–4.0)
MCH: 30.5 pg (ref 26.0–34.0)
MCHC: 33.8 g/dL (ref 30.0–36.0)
MCV: 90.3 fL (ref 80.0–100.0)
Monocytes Absolute: 1.3 10*3/uL — ABNORMAL HIGH (ref 0.1–1.0)
Monocytes Relative: 21 %
Neutro Abs: 3.4 10*3/uL (ref 1.7–7.7)
Neutrophils Relative %: 55 %
Platelets: 164 10*3/uL (ref 150–400)
RBC: 3.31 MIL/uL — ABNORMAL LOW (ref 4.22–5.81)
RDW: 14.3 % (ref 11.5–15.5)
WBC: 6.1 10*3/uL (ref 4.0–10.5)
nRBC: 0 % (ref 0.0–0.2)

## 2021-04-30 LAB — COMPREHENSIVE METABOLIC PANEL
ALT: 11 U/L (ref 0–44)
AST: 20 U/L (ref 15–41)
Albumin: 3.9 g/dL (ref 3.5–5.0)
Alkaline Phosphatase: 70 U/L (ref 38–126)
Anion gap: 10 (ref 5–15)
BUN: 20 mg/dL (ref 8–23)
CO2: 26 mmol/L (ref 22–32)
Calcium: 9.2 mg/dL (ref 8.9–10.3)
Chloride: 95 mmol/L — ABNORMAL LOW (ref 98–111)
Creatinine, Ser: 1.01 mg/dL (ref 0.61–1.24)
GFR, Estimated: >60 mL/min (ref >60)
Glucose, Bld: 99 mg/dL (ref 70–99)
Potassium: 3.4 mmol/L — ABNORMAL LOW (ref 3.5–5.1)
Sodium: 131 mmol/L — ABNORMAL LOW (ref 135–145)
Total Bilirubin: 0.5 mg/dL (ref 0.3–1.2)
Total Protein: 7 g/dL (ref 6.5–8.1)

## 2021-04-30 LAB — PSA: Prostatic Specific Antigen: <0.01 ng/mL (ref 0.00–4.00)

## 2021-04-30 LAB — POCT I-STAT CREATININE: Creatinine, Ser: 1.1 mg/dL (ref 0.61–1.24)

## 2021-04-30 MED ORDER — TECHNETIUM TC 99M MEDRONATE IV KIT
20.0000 | PACK | Freq: Once | INTRAVENOUS | Status: AC | PRN
  Administered 2021-04-30: 21.9 via INTRAVENOUS

## 2021-04-30 MED ORDER — IOHEXOL 300 MG/ML  SOLN
100.0000 mL | Freq: Once | INTRAMUSCULAR | Status: AC | PRN
  Administered 2021-04-30: 100 mL via INTRAVENOUS

## 2021-05-04 ENCOUNTER — Encounter: Payer: Self-pay | Admitting: Oncology

## 2021-05-04 ENCOUNTER — Inpatient Hospital Stay (HOSPITAL_BASED_OUTPATIENT_CLINIC_OR_DEPARTMENT_OTHER): Payer: Medicare Other | Admitting: Oncology

## 2021-05-04 ENCOUNTER — Inpatient Hospital Stay: Payer: Medicare Other

## 2021-05-04 ENCOUNTER — Other Ambulatory Visit: Payer: Self-pay

## 2021-05-04 VITALS — BP 116/57 | HR 79 | Temp 98.4°F | Resp 16 | Ht 65.0 in | Wt 166.9 lb

## 2021-05-04 DIAGNOSIS — C61 Malignant neoplasm of prostate: Secondary | ICD-10-CM

## 2021-05-04 DIAGNOSIS — Z5111 Encounter for antineoplastic chemotherapy: Secondary | ICD-10-CM | POA: Diagnosis not present

## 2021-05-04 DIAGNOSIS — C7951 Secondary malignant neoplasm of bone: Secondary | ICD-10-CM | POA: Diagnosis not present

## 2021-05-04 DIAGNOSIS — Z191 Hormone sensitive malignancy status: Secondary | ICD-10-CM

## 2021-05-04 DIAGNOSIS — Z79899 Other long term (current) drug therapy: Secondary | ICD-10-CM | POA: Diagnosis not present

## 2021-05-04 MED ORDER — LEUPROLIDE ACETATE (6 MONTH) 45 MG ~~LOC~~ KIT
45.0000 mg | PACK | Freq: Once | SUBCUTANEOUS | Status: AC
  Administered 2021-05-04: 45 mg via SUBCUTANEOUS
  Filled 2021-05-04: qty 45

## 2021-05-07 ENCOUNTER — Other Ambulatory Visit (HOSPITAL_COMMUNITY): Payer: Self-pay

## 2021-05-09 ENCOUNTER — Encounter: Payer: Self-pay | Admitting: Oncology

## 2021-05-09 NOTE — Progress Notes (Signed)
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 Posey Pronto for symptoms of left hip pain which prompted x-rays followed by MRI of the left hip without contrast.  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  Patient was subsequently seen by orthopedics Dr. Pathology who did not recommend any orthopedic intervention for the left hip fracture he has been referred to oncology for further management patient's last PSA was checked in 2014 which was elevated at 7.4.  Patient is also a chronic smoker and smokes about 1 pack of cigarettes per day since 1968.   Head CT scan showed widespread bony metastatic disease with pathologic fracture of the left acetabulum.  Pathologic fracture of the T3 vertebral body with 50% loss of height possible fracture of the left seventh rib.  No evidence of visceral metastases.  PSA elevated at 35.  Bone biopsy consistent with prostate adenocarcinoma.  Unable to obtain NGS testing on bone sample   Patient received Mills Koller and is currently on Zytiga   Patient also seen at [facility] for second opinion related with [facility] and [facility].  They recommended bisphosphonates given pathologic fracture of the acetabulum    Interval history-overall patient is doing well.  Exam mild chronic  fatigue he denies other complaints at this time.  Hip pain is currently well controlled.  No recent hospitalizations  ECOG PS- 1 Pain scale- 0   Review of systems- Review of Systems  Constitutional:  Positive for malaise/fatigue. Negative for chills, fever and weight loss.  HENT:  Negative for congestion, ear discharge and nosebleeds.   Eyes:  Negative for blurred vision.  Respiratory:  Negative for cough, hemoptysis, sputum production, shortness of breath and wheezing.   Cardiovascular:  Negative for chest pain, palpitations, orthopnea and claudication.  Gastrointestinal:  Negative for abdominal pain, blood in stool, constipation, diarrhea, heartburn, melena, nausea and vomiting.  Genitourinary:  Negative for dysuria, flank pain, frequency, hematuria and urgency.  Musculoskeletal:  Negative for back pain, joint pain and myalgias.  Skin:  Negative for rash.  Neurological:  Negative for dizziness, tingling, focal weakness, seizures, weakness and headaches.  Endo/Heme/Allergies:  Does not bruise/bleed easily.  Psychiatric/Behavioral:  Negative for depression and suicidal ideas. The patient does not have insomnia.      Allergies  Allergen Reactions   Citalopram Other (See Comments)    Unsure but family remembers that he could not take the dru-not sure what happened     Past Medical History:  Diagnosis Date   Hypertension    Prostate cancer ([place])      History reviewed. No pertinent surgical history.  Social History   Socioeconomic History   Marital status: Married    Spouse name: Not on file   Number of children: Not on file   Years of education: Not on file   Highest education level: Not on file  Occupational History   Not on file  Tobacco Use   Smoking status: Former    Types: Pipe   Smokeless tobacco: Former   Tobacco comments:    stopped pipes 3 month ago  Vaping Use   Vaping Use: Never used  Substance and Sexual Activity   Alcohol use: Not Currently   Drug  use: Never   Sexual activity: Not on file  Other Topics Concern   Not on file  Social History Narrative   Not on file   Social Determinants of Health   Financial Resource Strain: Not on file  Food Insecurity: Not on file  Transportation Needs: Not on file  Physical Activity: Not on file  Stress: Not on file  Social Connections: Not on file  Intimate Partner Violence: Not on file    History reviewed. No pertinent family history.   Current Outpatient Medications:    abiraterone acetate (ZYTIGA) 250 MG tablet, TAKE 4 TABLETS (1,000 MG TOTAL) BY MOUTH DAILY. TAKE ON AN EMPTY STOMACH 1 HOUR BEFORE OR 2 HOURS AFTER A MEAL, Disp: 120 tablet, Rfl: 3   Cholecalciferol 125 MCG (5000 UT) capsule, Take 5,000 Units by mouth once a week., Disp: , Rfl:    cyanocobalamin 1000 MCG tablet, Take by mouth., Disp: , Rfl:    Docusate Sodium (DSS) 100 MG CAPS, Take by mouth., Disp: , Rfl:    donepezil (ARICEPT) 5 MG tablet, Take by mouth., Disp: , Rfl:    lisinopril-hydrochlorothiazide (ZESTORETIC) 20-12.5 MG tablet, Take 1 tablet by mouth once daily, Disp: 30 tablet, Rfl: 0   predniSONE (DELTASONE) 5 MG tablet, Take 1 tablet by mouth once daily with breakfast, Disp: 90 tablet, Rfl: 0   senna (SENOKOT) 8.6 MG tablet, Take by mouth., Disp: , Rfl:    traZODone (DESYREL) 50 MG tablet, Take 50 mg by mouth at bedtime as needed., Disp: , Rfl:  No current facility-administered medications for this visit.  Facility-Administered Medications Ordered in Other Visits:    Leuprolide Acetate (3 Month) (ELIGARD) 22.5 MG injection 22.5 mg, 22.5 mg, Subcutaneous, Q90 days, Sindy Guadeloupe, MD, 22.5 mg at 05/06/20 1116   Leuprolide Acetate (3 Month) (ELIGARD) 22.5 MG injection 22.5 mg, 22.5 mg, Subcutaneous, Q90 days, Sindy Guadeloupe, MD, 22.5 mg at 08/04/20 1044  Physical exam:  Vitals:   05/04/21 1032  BP: (!) 116/57  Pulse: 79  Resp: 16  Temp: 98.4 F (36.9 C)  TempSrc: Tympanic  SpO2: 100%  Weight: 166 lb  14.4 oz (75.7 kg)  Height: 5\' 5"  (1.651 m)   Physical Exam Cardiovascular:     Rate and Rhythm: Normal rate and regular rhythm.     Heart sounds: Normal heart sounds.  Pulmonary:     Effort: Pulmonary effort is normal.     Breath sounds: Normal breath sounds.  Skin:    General: Skin is warm and dry.  Neurological:     Mental Status: He is alert and oriented to person, place, and time.     CMP Latest Ref Rng & Units 04/30/2021  Glucose 70 - 99 mg/dL -  BUN 8 - 23 mg/dL -  Creatinine 0.61 - 1.24 mg/dL 1.10  Sodium 135 - 145 mmol/L -  Potassium 3.5 - 5.1 mmol/L -  Chloride 98 - 111 mmol/L -  CO2 22 - 32 mmol/L -  Calcium 8.9 - 10.3 mg/dL -  Total Protein 6.5 - 8.1 g/dL -  Total Bilirubin 0.3 - 1.2 mg/dL -  Alkaline Phos 38 - 126 U/L -  AST 15 -  41 U/L -  ALT 0 - 44 U/L -   CBC Latest Ref Rng & Units 04/30/2021  WBC 4.0 - 10.5 K/uL 6.1  Hemoglobin 13.0 - 17.0 g/dL 10.1(L)  Hematocrit 39.0 - 52.0 % 29.9(L)  Platelets 150 - 400 K/uL 164    No images are attached to the encounter.  NM Bone Scan Whole Body  Result Date: 05/02/2021 CLINICAL DATA:  Prostate cancer restaging. EXAM: NUCLEAR MEDICINE WHOLE BODY BONE SCAN TECHNIQUE: Whole body anterior and posterior images were obtained approximately 3 hours after intravenous injection of radiopharmaceutical. RADIOPHARMACEUTICALS:  21.9 mCi Technetium-90m MDP IV COMPARISON:  Jul 30, 2020 bone scan FINDINGS: Uptake in the sternum and ribs has improved but mild uptake remains. Overall, uptake in the pelvis has improved as well. There may be slightly increased uptake in the superior right sacrum on today's study compared to the previous study. Otherwise, uptake in the pelvis is improved. Uptake in the thoracic and lumbar spine appears mildly improved as well. Bony and soft tissue uptake otherwise unremarkable. IMPRESSION: 1. There may be slight increased uptake in the right superior sacrum which could represent mild metastatic worsening in this  location. 2. Otherwise, abnormal bony uptake seen on the previous study consistent with known metastases has improved. Electronically Signed   By: Dorise Bullion III M.D.   On: 05/02/2021 09:55   CT CHEST ABDOMEN PELVIS W CONTRAST  Result Date: 04/30/2021 CLINICAL DATA:  Prostate cancer.  Restaging. EXAM: CT CHEST, ABDOMEN, AND PELVIS WITH CONTRAST TECHNIQUE: Multidetector CT imaging of the chest, abdomen and pelvis was performed following the standard protocol during bolus administration of intravenous contrast. RADIATION DOSE REDUCTION: This exam was performed according to the departmental dose-optimization program which includes automated exposure control, adjustment of the mA and/or kV according to patient size and/or use of iterative reconstruction technique. CONTRAST:  134mL OMNIPAQUE IOHEXOL 300 MG/ML  SOLN COMPARISON:  07/30/2020 FINDINGS: CT CHEST FINDINGS Cardiovascular: The heart size is normal. No substantial pericardial effusion. Coronary artery calcification is evident. Aortic valve calcification evident. Mild atherosclerotic calcification is noted in the wall of the thoracic aorta. Mediastinum/Nodes: No mediastinal lymphadenopathy. There is no hilar lymphadenopathy. The esophagus has normal imaging features. There is no axillary lymphadenopathy. Lungs/Pleura: Subtle ground-glass nodularity in tree-in-bud configuration noted right upper lobe posteriorly (see image 53/3), new in the interval. No new suspicious pulmonary nodule or mass. No focal airspace consolidation. No pleural effusion. Paraspinal subpleural scarring is stable. Musculoskeletal: No worrisome lytic or sclerotic osseous abnormality. CT ABDOMEN PELVIS FINDINGS Hepatobiliary: No suspicious focal abnormality within the liver parenchyma. There is no evidence for gallstones, gallbladder wall thickening, or pericholecystic fluid. No intrahepatic or extrahepatic biliary dilation. Pancreas: No focal mass lesion. No dilatation of the main  duct. No intraparenchymal cyst. No peripancreatic edema. Spleen: No splenomegaly. No focal mass lesion. Adrenals/Urinary Tract: No adrenal nodule or mass. Central sinus cysts noted in both kidneys. No evidence for hydroureter. Mild bladder wall thickening noted anteriorly, possibly accentuated by underdistention, but thickening appears progressive in the interval. Stomach/Bowel: Stomach is unremarkable. No gastric wall thickening. No evidence of outlet obstruction. Duodenum is normally positioned as is the ligament of Treitz. Presumed ingested food debris within the transverse duodenum. No small bowel wall thickening. No small bowel dilatation. The terminal ileum is normal. The appendix is normal. No gross colonic mass. No colonic wall thickening. Vascular/Lymphatic: There is moderate atherosclerotic calcification of the abdominal aorta without aneurysm. There is no gastrohepatic or hepatoduodenal ligament lymphadenopathy. No retroperitoneal or mesenteric  lymphadenopathy. No pelvic sidewall lymphadenopathy. Reproductive: The prostate gland and seminal vesicles are unremarkable. Other: No intraperitoneal free fluid. Musculoskeletal: Subtle lucency in the L1 vertebral body is indeterminate as this was not suspicious on previous bone scan. There is also a very subtle sclerotic focus in the manubrium (image 10/series 2) but this showed no abnormal uptake on previous bone scan. Superior endplate compression deformity at L3 is stable. Stable appearance T3 compression deformity. IMPRESSION: 1. No evidence for soft tissue metastatic disease in the chest, abdomen, or pelvis. 2. Subtle ground-glass nodularity in tree-in-bud configuration right upper lobe posteriorly, new in the interval. Imaging features likely reflect infectious/inflammatory etiology. Follow-up CT chest without contrast in 3 months after therapy could be used to ensure resolution. 3. Mild bladder wall thickening anteriorly, possibly accentuated by  underdistention, but thickening appears progressive in the interval. 4. Subtle lucency in the L1 vertebral body is indeterminate as this was not suspicious on previous bone scan and is unchanged in appearance on CT imaging. Attention on follow-up recommended. 5. Patient was noted to have areas of suspicious uptake in the ribs, pelvis and sternum on previous bone scan without discernible bone lesions in these regions today on CT imaging. 6. Stable appearance T3 and L3 compression fractures. 7. Aortic Atherosclerosis (ICD10-I70.0). Electronically Signed   By: Misty Stanley M.D.   On: 04/30/2021 16:46     Assessment and plan- Patient is a 86 y.o. 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  He would be due for Lupron today which he is getting every 6 months.  Also reviewed results of CT chest abdomen pelvis with contrast and bone scan which overall shows stable areas of bony metastatic disease.  Slightly increased uptake in the sacrum but given that the PSA remains undetectable there is no reason to change treatment at this time.  I will see him back in 3 months with CBC with differential CMP and PSA   Visit Diagnosis 1. Prostate cancer metastatic to bone ([place])   2. High risk medication use      Dr. Randa Evens, MD, MPH [facility] [phone] 05/09/2021 5:23 PM

## 2021-05-26 ENCOUNTER — Other Ambulatory Visit (HOSPITAL_COMMUNITY): Payer: Self-pay

## 2021-05-27 ENCOUNTER — Other Ambulatory Visit (HOSPITAL_COMMUNITY): Payer: Self-pay

## 2021-05-29 ENCOUNTER — Other Ambulatory Visit (HOSPITAL_COMMUNITY): Payer: Self-pay

## 2021-06-04 ENCOUNTER — Other Ambulatory Visit (HOSPITAL_COMMUNITY): Payer: Self-pay

## 2021-06-21 ENCOUNTER — Other Ambulatory Visit: Payer: Self-pay | Admitting: Oncology

## 2021-06-21 DIAGNOSIS — Z191 Hormone sensitive malignancy status: Secondary | ICD-10-CM

## 2021-06-22 ENCOUNTER — Encounter: Payer: Self-pay | Admitting: Oncology

## 2021-06-26 ENCOUNTER — Other Ambulatory Visit (HOSPITAL_COMMUNITY): Payer: Self-pay

## 2021-06-26 ENCOUNTER — Encounter: Payer: Self-pay | Admitting: Oncology

## 2021-06-26 ENCOUNTER — Other Ambulatory Visit: Payer: Self-pay | Admitting: Oncology

## 2021-06-26 DIAGNOSIS — Z191 Hormone sensitive malignancy status: Secondary | ICD-10-CM

## 2021-06-26 MED ORDER — ABIRATERONE ACETATE 250 MG PO TABS
ORAL_TABLET | Freq: Every day | ORAL | 3 refills | Status: DC
  Filled 2021-06-30: qty 120, 30d supply, fill #0
  Filled 2021-07-21: qty 120, 30d supply, fill #1
  Filled 2021-09-03: qty 120, 30d supply, fill #2
  Filled 2021-09-29: qty 120, 30d supply, fill #3

## 2021-06-26 NOTE — Telephone Encounter (Signed)
CBC with Differential ?Order: [id number] ?Status: Final result    ?Visible to patient: Yes (seen)    ?Next appt: 07/21/2021 at 10:15 AM in Oncology ([facility])    ?Dx: Prostate cancer metastatic to bone [facility])    ?0 Result Notes ?          ?Component Ref Range & Units 1 mo ago ?(04/30/21) 4 mo ago ?(02/06/21) 8 mo ago ?(10/29/20) 10 mo ago ?(08/04/20) 1 yr ago ?(05/06/20) 1 yr ago ?(04/22/20) 1 yr ago ?(04/21/20)  ?WBC 4.0 - 10.5 K/uL 6.1  7.1  6.5  6.7  8.2  4.6  4.7   ?RBC 4.22 - 5.81 MIL/uL 3.31 Low   3.56 Low   3.37 Low   3.45 Low   3.11 Low   2.66 Low   2.70 Low    ?Hemoglobin 13.0 - 17.0 g/dL 10.1 Low   11.0 Low   10.5 Low   10.9 Low   9.7 Low   8.4 Low   8.6 Low    ?HCT 39.0 - 52.0 % 29.9 Low   32.6 Low   31.4 Low   32.1 Low   29.1 Low   23.5 Low   23.2 Low    ?MCV 80.0 - 100.0 fL 90.3  91.6  93.2  93.0  93.6  88.3  85.9   ?MCH 26.0 - 34.0 pg 30.5  30.9  31.2  31.6  31.2  31.6  31.9   ?MCHC 30.0 - 36.0 g/dL 33.8  33.7  33.4  34.0  33.3  35.7  37.1 High    ?RDW 11.5 - 15.5 % 14.3  14.1  14.4  14.6  17.0 High   14.3  14.0   ?Platelets 150 - 400 K/uL 164  191  190  184  206  161  146 Low    ?nRBC 0.0 - 0.2 % 0.0  0.0  0.0  0.0  0.0  0.0 CM  0.0 CM   ?Neutrophils Relative % % 55  58  78  57  74     ?Neutro Abs 1.7 - 7.7 K/uL 3.4  4.1  5.2  3.8  6.1     ?Lymphocytes Relative % '21  21  13  21  14     '$ ?Lymphs Abs 0.7 - 4.0 K/uL 1.3  1.5  0.8  1.4  1.2     ?Monocytes Relative % '21  19  7  19  9     '$ ?Monocytes Absolute 0.1 - 1.0 K/uL 1.3 High   1.3 High   0.4  1.3 High   0.7     ?Eosinophils Relative % '2  1  1  2  2     '$ ?Eosinophils Absolute 0.0 - 0.5 K/uL 0.1  0.1  0.0  0.1  0.1     ?Basophils Relative % 0  0  0  0  0     ?Basophils Absolute 0.0 - 0.1 K/uL 0.0  0.0  0.0  0.0  0.0     ?Immature Granulocytes % '1  1  1  1  1     '$ ?Abs Immature Granulocytes 0.00 - 0.07 K/uL 0.03  0.05 CM  0.03 CM  0.04 CM  0.08 High  CM     ?Comment: Performed at [facility], [address] [place], [place] [address]  ?Resulting Agency   [place] [facility] [place] [facility] [place] [facility] [place] [facility] [place] [facility] [place] [facility]  CLIN LAB  ?  ? ?  ?  ?Specimen Collected: 04/30/21 08:11 Last Resulted: 04/30/21 08:27  ?  ?  Lab Flowsheet   ? Order Details   ? View Encounter   ? Lab and Collection Details   ? Routing   ? Result History    ?View Encounter Conversation    ?  ?CM=Additional comments    ?  ?Result Care Coordination ? ? ?Patient Communication ? ? Add Comments   Seen Back to Top  ?  ?  ? ?Other Results from 04/30/2021 ? ?PSA ?Order: [id number] ?Status: Final result    ?Visible to patient: Yes (seen)    ?Next appt: 07/21/2021 at 10:15 AM in Oncology ([facility])    ?Dx: Prostate cancer metastatic to bone [facility])    ?0 Result Notes ?          ?Component Ref Range & Units 1 mo ago ?(04/30/21) 4 mo ago ?(02/06/21) 8 mo ago ?(10/29/20) 10 mo ago ?(08/04/20) 1 yr ago ?(05/06/20) 1 yr ago ?(02/04/20) 1 yr ago ?(01/03/20)  ?Prostatic Specific Antigen 0.00 - 4.00 ng/mL <0.01  <0.01 CM  <0.01 CM  <0.01 CM  0.02 CM  0.26 CM  2.70 CM   ?Comment: (NOTE)  ?While PSA levels of <=4.0 ng/ml are reported as reference range, some  ?men with levels below 4.0 ng/ml can have prostate cancer and many men  ?with PSA above 4.0 ng/ml do not have prostate cancer.  Other tests  ?such as free PSA, age specific reference ranges, PSA velocity and PSA  ?doubling time may be helpful especially in men less than 3 years  ?old.  ?Performed at [facility], [place] [address], [place], [place]  ?[id number]   ?Resulting Agency  [place] [facility] [place] [facility] [place] [facility] [place] [facility] [place] [facility] [place] [facility] [place] [facility]  ?  ? ?  ?  ?Specimen Collected: 04/30/21 08:11 Last Resulted: 04/30/21 13:36  ?  ?  Lab Flowsheet   ? Order Details   ? View Encounter   ? Lab and Collection Details   ? Routing   ? Result History    ?View Encounter Conversation    ?  ?CM=Additional comments    ?  ?Result Care Coordination ? ? ?Patient Communication ? ? Add Comments   Seen Back to Top  ?  ?  ? ?  ? Contains abnormal data  Comprehensive metabolic panel ?Order: [id number] ?Status: Final result    ?Visible to patient: Yes (seen)    ?Next appt: 07/21/2021 at 10:15 AM in Oncology ([facility])    ?Dx: Prostate cancer metastatic to bone [facility])    ?0 Result Notes ?          ?Component Ref Range & Units 1 mo ago ?(04/30/21) 4 mo ago ?(02/06/21) 8 mo ago ?(10/29/20) 10 mo ago ?(08/04/20) 11 mo ago ?(07/30/20) 1 yr ago ?(05/06/20) 1 yr ago ?(04/22/20)  ?Sodium 135 - 145 mmol/L 131 Low   134 Low   130 Low   133 Low    136  135   ?Potassium 3.5 - 5.1 mmol/L 3.4 Low   3.7  4.4  3.9   3.5  3.9   ?Chloride 98 - 111 mmol/L 95 Low   99  95 Low   97 Low    99  102   ?CO2 22 - 32 mmol/L '26  25  26  25   24  23   '$ ?  Glucose, Bld 70 - 99 mg/dL 99  109 High  CM  114 High  CM  105 High  CM   110 High  CM  96 CM   ?Comment: Glucose reference range applies only to samples taken after fasting for at least 8 hours.  ?BUN 8 - 23 mg/dL 20  27 High   32 High   28 High    27 High   12   ?Creatinine, Ser 0.61 - 1.24 mg/dL 1.01  1.15  1.26 High   1.11  1.00  1.03  0.93   ?Calcium 8.9 - 10.3 mg/dL 9.2  9.2  9.3  9.5   9.2  8.5 Low    ?Total Protein 6.5 - 8.1 g/dL 7.0  7.7  7.6  7.5   7.6    ?Albumin 3.5 - 5.0 g/dL 3.9  4.2  4.4  4.1   3.8    ?AST 15 - 41 U/L '20  25  23  24   23    '$ ?ALT 0 - 44 U/L '11  14  12  14   14    '$ ?Alkaline Phosphatase 38 - 126 U/L 70  77  69  73   68    ?Total Bilirubin 0.3 - 1.2 mg/dL 0.5  0.5  1.0  1.1   1.1    ?GFR, Estimated >60 mL/min >60  >60 CM  57 Low  CM  >60 CM   >60 CM  >60 CM   ?Comment: (NOTE)  ?Calculated using the CKD-EPI Creatinine Equation (2021)   ?Anion gap 5 - '15 10  10 '$ CM  9 CM  11 CM   13 CM  10 CM   ?Comment: Performed at [facility], [address], [place], [place] [address]  ?Resulting Agency  [place] [facility] [place] [facility] [place] [facility] [place] [facility] [place] [facility] [place] [facility] [place] [facility]  ?  ? ?  ?  ?Specimen Collected: 04/30/21 08:11 Last Resulted: 04/30/21 08:38  ?  ?  ?  ? ?

## 2021-06-30 ENCOUNTER — Other Ambulatory Visit (HOSPITAL_COMMUNITY): Payer: Self-pay

## 2021-07-03 ENCOUNTER — Other Ambulatory Visit (HOSPITAL_COMMUNITY): Payer: Self-pay

## 2021-07-09 DIAGNOSIS — D638 Anemia in other chronic diseases classified elsewhere: Secondary | ICD-10-CM | POA: Insufficient documentation

## 2021-07-21 ENCOUNTER — Encounter: Payer: Self-pay | Admitting: Oncology

## 2021-07-21 ENCOUNTER — Inpatient Hospital Stay (HOSPITAL_BASED_OUTPATIENT_CLINIC_OR_DEPARTMENT_OTHER): Payer: Medicare Other | Admitting: Oncology

## 2021-07-21 ENCOUNTER — Inpatient Hospital Stay: Payer: Medicare Other | Attending: Oncology

## 2021-07-21 ENCOUNTER — Other Ambulatory Visit (HOSPITAL_COMMUNITY): Payer: Self-pay

## 2021-07-21 VITALS — BP 186/74 | HR 75 | Temp 98.1°F | Resp 16 | Wt 173.4 lb

## 2021-07-21 DIAGNOSIS — N329 Bladder disorder, unspecified: Secondary | ICD-10-CM | POA: Diagnosis not present

## 2021-07-21 DIAGNOSIS — Z191 Hormone sensitive malignancy status: Secondary | ICD-10-CM | POA: Diagnosis not present

## 2021-07-21 DIAGNOSIS — Z79899 Other long term (current) drug therapy: Secondary | ICD-10-CM

## 2021-07-21 DIAGNOSIS — C61 Malignant neoplasm of prostate: Secondary | ICD-10-CM | POA: Insufficient documentation

## 2021-07-21 DIAGNOSIS — N3289 Other specified disorders of bladder: Secondary | ICD-10-CM

## 2021-07-21 DIAGNOSIS — C7951 Secondary malignant neoplasm of bone: Secondary | ICD-10-CM | POA: Diagnosis not present

## 2021-07-21 LAB — COMPREHENSIVE METABOLIC PANEL
ALT: 11 U/L (ref 0–44)
AST: 22 U/L (ref 15–41)
Albumin: 4 g/dL (ref 3.5–5.0)
Alkaline Phosphatase: 68 U/L (ref 38–126)
Anion gap: 6 (ref 5–15)
BUN: 22 mg/dL (ref 8–23)
CO2: 23 mmol/L (ref 22–32)
Calcium: 9 mg/dL (ref 8.9–10.3)
Chloride: 103 mmol/L (ref 98–111)
Creatinine, Ser: 1.12 mg/dL (ref 0.61–1.24)
GFR, Estimated: >60 mL/min (ref >60)
Glucose, Bld: 98 mg/dL (ref 70–99)
Potassium: 4 mmol/L (ref 3.5–5.1)
Sodium: 132 mmol/L — ABNORMAL LOW (ref 135–145)
Total Bilirubin: 0.8 mg/dL (ref 0.3–1.2)
Total Protein: 7 g/dL (ref 6.5–8.1)

## 2021-07-21 LAB — CBC WITH DIFFERENTIAL/PLATELET
Abs Immature Granulocytes: 0.05 10*3/uL (ref 0.00–0.07)
Basophils Absolute: 0 10*3/uL (ref 0.0–0.1)
Basophils Relative: 0 %
Eosinophils Absolute: 0.1 10*3/uL (ref 0.0–0.5)
Eosinophils Relative: 1 %
HCT: 31.5 % — ABNORMAL LOW (ref 39.0–52.0)
Hemoglobin: 10.4 g/dL — ABNORMAL LOW (ref 13.0–17.0)
Immature Granulocytes: 1 %
Lymphocytes Relative: 24 %
Lymphs Abs: 1.4 10*3/uL (ref 0.7–4.0)
MCH: 31 pg (ref 26.0–34.0)
MCHC: 33 g/dL (ref 30.0–36.0)
MCV: 94 fL (ref 80.0–100.0)
Monocytes Absolute: 1 10*3/uL (ref 0.1–1.0)
Monocytes Relative: 17 %
Neutro Abs: 3.3 10*3/uL (ref 1.7–7.7)
Neutrophils Relative %: 57 %
Platelets: 154 10*3/uL (ref 150–400)
RBC: 3.35 MIL/uL — ABNORMAL LOW (ref 4.22–5.81)
RDW: 15.2 % (ref 11.5–15.5)
WBC: 5.8 10*3/uL (ref 4.0–10.5)
nRBC: 0 % (ref 0.0–0.2)

## 2021-07-21 LAB — PSA: Prostatic Specific Antigen: <0.01 ng/mL (ref 0.00–4.00)

## 2021-07-21 NOTE — Progress Notes (Signed)
? ? ? ?Hematology/Oncology Consult note ?[place]  ?[phone] [phone] ? ?Patient Care Team: ?Dion Body, MD as PCP - General (Family Medicine) ?Sindy Guadeloupe, MD as Consulting Physician (Hematology and Oncology)  ? ?Name of the patient: Alexis Cruz  ?[id number]  ?03/13/1936  ? ?Date of visit: 07/21/21 ? 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 ?Chief complaint/ Reason for visit-routine follow-up of prostate cancer ? ?Heme/Onc history: Patient is a 86 year old Caucasian gentleman who was initially seen by rheumatology Dr. Posey Pronto for symptoms of left hip pain which prompted x-rays followed by MRI of the left hip without contrast.  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  Patient was subsequently seen by orthopedics Dr. Pathology who did not recommend any orthopedic intervention for the left hip fracture he has been referred to oncology for further management patient's last PSA was checked in 2014 which was elevated at 7.4.  Patient is also a chronic smoker and smokes about 1 pack of cigarettes per day since 1968. ?  ?Head CT scan showed widespread bony metastatic disease with pathologic fracture of the left acetabulum.  Pathologic fracture of the T3 vertebral body with 50% loss of height possible fracture of the left seventh rib.  No evidence of visceral metastases.  PSA elevated at 35.  Bone biopsy consistent with prostate adenocarcinoma.  Unable to obtain NGS testing on bone sample ?  ?Patient received Mills Koller and is currently on Zytiga ?  ?Patient also seen at [facility] for second opinion related with [facility] and ADT.  They recommended bisphosphonates given pathologic fracture of the acetabulum ?  ?  ? ?Interval history-patient reports some exertional shortness of breath for the  last 2 weeks.  His blood pressure has been running high despite making recent changes in his medications.  Tolerating Zytiga well without any significant side effects.  Pain is currently well controlled ? ?ECOG PS- 1 ?Pain scale- 0 ? ? ?Review of systems- Review of Systems  ?Constitutional:  Positive for malaise/fatigue.  ?Respiratory:  Positive for shortness of breath.    ? ?Allergies  ?Allergen Reactions  ? Citalopram Other (See Comments)  ?  Unsure but family remembers that he could not take the dru-not sure what happened  ? ? ? ?Past Medical History:  ?Diagnosis Date  ? Hypertension   ? Prostate cancer ([place])   ? ? ? ?No past surgical history on file. ? ?Social History  ? ?Socioeconomic History  ? Marital status: Married  ?  Spouse name: Not on file  ? Number of children: Not on file  ? Years of education: Not on file  ? Highest education level: Not on file  ?Occupational History  ? Not on file  ?Tobacco Use  ? Smoking status: Former  ?  Types: Pipe  ? Smokeless tobacco: Former  ? Tobacco comments:  ?  stopped pipes 3 month ago  ?Vaping Use  ? Vaping Use: Never used  ?Substance and Sexual Activity  ? Alcohol use: Not Currently  ? Drug use: Never  ? Sexual activity: Not on file  ?Other Topics Concern  ? Not on file  ?Social History Narrative  ? Not on file  ? ?Social Determinants of Health  ? ?Financial Resource Strain: Not on file  ?Food Insecurity: Not on file  ?Transportation Needs: Not on file  ?Physical Activity: Not on  file  ?Stress: Not on file  ?Social Connections: Not on file  ?Intimate Partner Violence: Not on file  ? ? ?No family history on file. ? ? ?Current Outpatient Medications:  ?  abiraterone acetate (ZYTIGA) 250 MG tablet, TAKE 4 TABLETS (1,000 MG TOTAL) BY MOUTH DAILY. TAKE ON AN EMPTY STOMACH 1 HOUR BEFORE OR 2 HOURS AFTER A MEAL, Disp: 120 tablet, Rfl: 3 ?  Cholecalciferol 125 MCG (5000 UT) capsule, Take 5,000 Units by mouth once a week., Disp: , Rfl:  ?  cyanocobalamin 1000 MCG tablet, Take  by mouth., Disp: , Rfl:  ?  lisinopril (ZESTRIL) 20 MG tablet, Take 20 mg by mouth daily., Disp: , Rfl:  ?  Omega-3 Fatty Acids (FISH OIL) 1200 MG CAPS, Take 1,200 mg by mouth daily., Disp: , Rfl:  ?  predniSONE (DELTASONE) 5 MG tablet, Take 1 tablet by mouth once daily with breakfast, Disp: 90 tablet, Rfl: 0 ?  zinc gluconate 50 MG tablet, Take 50 mg by mouth daily., Disp: , Rfl:  ?  Docusate Sodium (DSS) 100 MG CAPS, Take by mouth. (Patient not taking: Reported on 07/21/2021), Disp: , Rfl:  ?  donepezil (ARICEPT) 5 MG tablet, Take by mouth. (Patient not taking: Reported on 07/21/2021), Disp: , Rfl:  ?  lisinopril-hydrochlorothiazide (ZESTORETIC) 20-12.5 MG tablet, Take 1 tablet by mouth once daily (Patient not taking: Reported on 07/21/2021), Disp: 30 tablet, Rfl: 0 ?  senna (SENOKOT) 8.6 MG tablet, Take by mouth. (Patient not taking: Reported on 07/21/2021), Disp: , Rfl:  ?  traZODone (DESYREL) 50 MG tablet, Take 50 mg by mouth at bedtime as needed. (Patient not taking: Reported on 07/21/2021), Disp: , Rfl:  ?No current facility-administered medications for this visit. ? ?Facility-Administered Medications Ordered in Other Visits:  ?  Leuprolide Acetate (3 Month) (ELIGARD) 22.5 MG injection 22.5 mg, 22.5 mg, Subcutaneous, Q90 days, Sindy Guadeloupe, MD, 22.5 mg at 05/06/20 1116 ?  Leuprolide Acetate (3 Month) (ELIGARD) 22.5 MG injection 22.5 mg, 22.5 mg, Subcutaneous, Q90 days, Sindy Guadeloupe, MD, 22.5 mg at 08/04/20 1044 ? ?Physical exam:  ?Vitals:  ? 07/21/21 1032  ?BP: (!) 186/74  ?Pulse: 75  ?Resp: 16  ?Temp: 98.1 ?F (36.7 ?C)  ?SpO2: 99%  ?Weight: 173 lb 6.4 oz (78.7 kg)  ? ?Physical Exam ?Cardiovascular:  ?   Rate and Rhythm: Normal rate and regular rhythm.  ?   Heart sounds: Normal heart sounds.  ?Pulmonary:  ?   Effort: Pulmonary effort is normal.  ?   Breath sounds: Normal breath sounds.  ?Abdominal:  ?   General: Bowel sounds are normal.  ?   Palpations: Abdomen is soft.  ?Musculoskeletal:  ?   Right lower leg: No  edema.  ?   Left lower leg: No edema.  ?Skin: ?   General: Skin is warm and dry.  ?Neurological:  ?   Mental Status: He is alert and oriented to person, place, and time.  ?  ? ? ?  Latest Ref Rng & Units 07/21/2021  ? 10:11 AM  ?CMP  ?Glucose 70 - 99 mg/dL 98    ?BUN 8 - 23 mg/dL 22    ?Creatinine 0.61 - 1.24 mg/dL 1.12    ?Sodium 135 - 145 mmol/L 132    ?Potassium 3.5 - 5.1 mmol/L 4.0    ?Chloride 98 - 111 mmol/L 103    ?CO2 22 - 32 mmol/L 23    ?Calcium 8.9 - 10.3 mg/dL 9.0    ?  Total Protein 6.5 - 8.1 g/dL 7.0    ?Total Bilirubin 0.3 - 1.2 mg/dL 0.8    ?Alkaline Phos 38 - 126 U/L 68    ?AST 15 - 41 U/L 22    ?ALT 0 - 44 U/L 11    ? ? ?  Latest Ref Rng & Units 07/21/2021  ? 10:11 AM  ?CBC  ?WBC 4.0 - 10.5 K/uL 5.8    ?Hemoglobin 13.0 - 17.0 g/dL 10.4    ?Hematocrit 39.0 - 52.0 % 31.5    ?Platelets 150 - 400 K/uL 154    ? ? ? ?Assessment and plan- Patient is a 86 y.o. male with metastatic castrate sensitive prostate cancer and bone metastases on Zytiga here for routine follow-up ? ?With regards to prostate cancer he is doing well.  PSA today is pending but prior values have shown an undetectable PSA.  He will receive Lupron in August 2023.  Continue Zytiga until progression or toxicity.  He had repeat CT and bone scanIn February 2023 which did not show any evidence of progressive disease. ? ?Patient noted to have groundglass opacity in the right upper lobe.  This was nonspecific infectious/inflammatory in etiology.  Patient complains of exertional shortness of breath after his blood pressure medications were changed.  His lungs sound clear on auscultation.  I am holding off on repeat CT chest at this time.  We would be doing repeat imaging sometime 6 months from now. ? ?Patient was noted to have bladder wall thickening on his CT scan in February.  I will refer him to urology for this ? ?I will see him back in August 2023 for his Lupron ?  ?Visit Diagnosis ?1. Metastatic castration-sensitive adenocarcinoma of prostate  ([place])   ?2. Bladder wall thickening   ?3. High risk medication use   ?4. Encounter for monitoring Lupron therapy   ? ? ? ?Dr. Randa Evens, MD, MPH ?[facility] ?[phone] ?5/2

## 2021-07-25 DIAGNOSIS — F0280 Dementia in other diseases classified elsewhere without behavioral disturbance: Secondary | ICD-10-CM | POA: Insufficient documentation

## 2021-07-30 ENCOUNTER — Other Ambulatory Visit (HOSPITAL_COMMUNITY): Payer: Self-pay

## 2021-07-30 ENCOUNTER — Encounter: Payer: Self-pay | Admitting: Oncology

## 2021-07-30 ENCOUNTER — Ambulatory Visit (INDEPENDENT_AMBULATORY_CARE_PROVIDER_SITE_OTHER): Payer: Medicare Other | Admitting: Urology

## 2021-07-30 VITALS — BP 153/69 | HR 76 | Ht 65.0 in | Wt 167.0 lb

## 2021-07-30 DIAGNOSIS — Z191 Hormone sensitive malignancy status: Secondary | ICD-10-CM | POA: Diagnosis not present

## 2021-07-30 DIAGNOSIS — Z8546 Personal history of malignant neoplasm of prostate: Secondary | ICD-10-CM

## 2021-07-30 DIAGNOSIS — N3289 Other specified disorders of bladder: Secondary | ICD-10-CM

## 2021-07-30 DIAGNOSIS — R3912 Poor urinary stream: Secondary | ICD-10-CM | POA: Diagnosis not present

## 2021-07-30 LAB — MICROSCOPIC EXAMINATION: Bacteria, UA: NONE SEEN

## 2021-07-30 LAB — URINALYSIS, COMPLETE
Bilirubin, UA: NEGATIVE
Glucose, UA: NEGATIVE
Ketones, UA: NEGATIVE
Leukocytes,UA: NEGATIVE
Nitrite, UA: NEGATIVE
Protein,UA: NEGATIVE
RBC, UA: NEGATIVE
Specific Gravity, UA: 1.015 (ref 1.005–1.030)
Urobilinogen, Ur: 0.2 mg/dL (ref 0.2–1.0)
pH, UA: 6.5 (ref 5.0–7.5)

## 2021-07-30 LAB — BLADDER SCAN AMB NON-IMAGING: Scan Result: 77

## 2021-07-30 NOTE — Progress Notes (Addendum)
? ?07/30/2021 ?2:19 PM  ? ?Alexis Cruz ?[birth year] ?[id number] ? ?Referring provider:  ?Sindy Guadeloupe, MD ?[place],  [place] [address] ?Chief Complaint  ?Patient presents with  ? Prostate Cancer  ? ? ? ?HPI: ?Alexis Cruz is a 86 y.o.male who presents today for further evaluation of bladder wall thickening.  ? 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 He is followed by Dr Janese Banks in oncology. He was initially seen by rheumatology for left hip pain. 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 He was not recommended any orthopedic intervention for his hip fracture.  ? ?Head CT scan showed widespread bony metastatic disease with pathologic fracture of the left acetabulum.  Pathologic fracture of the T3 vertebral body with 50% loss of height possible fracture of the left seventh rib.  No evidence of visceral metastases.  PSA elevated at 35.  Bone biopsy consistent with prostate adenocarcinoma.  Unable to obtain NGS testing on bone sample ? ?He received firmagon and is currently on Zytiga. He followed up with [facility]ology for a second opinion and they recommended bisphosponated in light of pathologic fracture of the acetabalum.  ? ?His PSA remains undetectable on 07/21/2021.  ? ?He underwent a chest ct abdomen and pelvis on 04/30/2021 that visualized . no evidence for soft tissue metastatic disease in the chest, ?abdomen, or pelvis mild bladder wall thickening anteriorly, possibly accentuated by ?underdistention, but thickening appears progressive in the interval. ? ?NM bone scan on 04/30/2021 visualized slight increased uptake in the right superior sacrum ?which could represent mild metastatic worsening in this location.  ? ?He has a slow stream when he sits down to urinate. When he stands is stream is weak.  He denies pressure. Patient denies any modifying or aggravating factors.  Patient denies any gross hematuria, dysuria or suprapubic/flank pain.  Patient denies any fevers, chills, nausea or vomiting.  ? ?He has had palliative radiation but no specific radiation to his prostate and/or bladder. ? ? IPSS   ? ? [place] Name 07/30/21 1000  ?  ?  ?  ? International Prostate Symptom Score  ? How often have you had the sensation of not emptying your bladder? Less than half the time    ? How often have you had to urinate less than every two hours? Less than half the time    ? How often have you found you stopped and started again several times when you urinated? Not at All    ? How often have you found it difficult to postpone urination? Almost always    ? How often have you had a weak urinary stream? Less than 1 in 5 times    ? How often have you had to strain to start urination? Not at All    ? How many times did you typically get up at night to urinate? 3 Times    ? Total IPSS Score 13    ?  ? Quality of Life due to urinary symptoms  ? If you were to spend the rest of your life with your urinary condition just the way it is now how would you feel about that? Mostly Satisfied    ? ?  ?  ? ?  ? ? Rec cysto  ?Score:  ?1-7 Mild ?8-19 Moderate ?20-35 Severe ? ? ?PMH: ?Past  Medical History:  ?Diagnosis Date  ? Hypertension   ? Prostate cancer ([place])   ? ? ?Surgical History: ?No past surgical history on file. ? ?Home Medications:  ?Allergies as of 07/30/2021   ? ?   Reactions  ? Citalopram Other (See Comments)  ? Unsure but family remembers that he could not take the dru-not sure what happened  ? ?  ? ?  ?Medication List  ?  ? ?  ? Accurate as of Jul 30, 2021  2:19 PM. If you have any questions, ask your nurse or doctor.  ?  ?  ? ?  ? ?abiraterone acetate 250 MG tablet ?Commonly known as: ZYTIGA ?TAKE 4 TABLETS (1,000 MG TOTAL) BY MOUTH DAILY. TAKE ON AN EMPTY STOMACH 1 HOUR BEFORE OR 2 HOURS AFTER A MEAL ?  ?Cholecalciferol 125 MCG  (5000 UT) capsule ?Take 5,000 Units by mouth once a week. ?  ?cyanocobalamin 1000 MCG tablet ?Take by mouth. ?  ?donepezil 5 MG tablet ?Commonly known as: ARICEPT ?Take by mouth. ?  ?DSS 100 MG Caps ?Take by mouth. ?  ?Fish Oil 1200 MG Caps ?Take 1,200 mg by mouth daily. ?  ?lisinopril 20 MG tablet ?Commonly known as: ZESTRIL ?Take 20 mg by mouth daily. ?  ?lisinopril-hydrochlorothiazide 20-12.5 MG tablet ?Commonly known as: ZESTORETIC ?Take 1 tablet by mouth once daily ?  ?predniSONE 5 MG tablet ?Commonly known as: DELTASONE ?Take 1 tablet by mouth once daily with breakfast ?  ?senna 8.6 MG tablet ?Commonly known as: SENOKOT ?Take by mouth. ?  ?traZODone 50 MG tablet ?Commonly known as: DESYREL ?Take 50 mg by mouth at bedtime as needed. ?  ?zinc gluconate 50 MG tablet ?Take 50 mg by mouth daily. ?  ? ?  ? ? ?Allergies:  ?Allergies  ?Allergen Reactions  ? Citalopram Other (See Comments)  ?  Unsure but family remembers that he could not take the dru-not sure what happened  ? ? ?Family History: ?No family history on file. ? ?Social History:  reports that he has quit smoking. His smoking use included pipe. He has quit using smokeless tobacco. He reports that he does not currently use alcohol. He reports that he does not use drugs. ? ? ?Physical Exam: ?BP (!) 153/69   Pulse 76   Ht '5\' 5"'$  (1.651 m)   Wt 167 lb (75.8 kg)   BMI 27.79 kg/m?   ?Constitutional:  Alert and oriented, No acute distress. ?HEENT:  AT, moist mucus membranes.  Trachea midline, no masses. ?Cardiovascular: No clubbing, cyanosis, or edema. ?Respiratory: Normal respiratory effort, no increased work of breathing. ?Skin: No rashes, bruises or suspicious lesions. ?Neurologic: Grossly intact, no focal deficits, moving all 4 extremities. ?Psychiatric: Normal mood and affect. ? ?Laboratory Data: ? ?Lab Results  ?Component Value Date  ? CREATININE 1.12 07/21/2021  ? ? ?Urinalysis ?Unremarkable ? ?Pertinent Imaging: ?CLINICAL DATA:  Prostate cancer.   Restaging. ?  ?EXAM: ?CT CHEST, ABDOMEN, AND PELVIS WITH CONTRAST ?  ?TECHNIQUE: ?Multidetector CT imaging of the chest, abdomen and pelvis was ?performed following the standard protocol during bolus ?administration of intravenous contrast. ?  ?RADIATION DOSE REDUCTION: This exam was performed according to the ?departmental dose-optimization program which includes automated ?exposure control, adjustment of the mA and/or kV according to ?patient size and/or use of iterative reconstruction technique. ?  ?CONTRAST:  1108m OMNIPAQUE IOHEXOL 300 MG/ML  SOLN ?  ?COMPARISON:  07/30/2020 ?  ?FINDINGS: ?CT CHEST FINDINGS ?  ?Cardiovascular: The heart size is normal.  No substantial pericardial ?effusion. Coronary artery calcification is evident. Aortic valve ?calcification evident. Mild atherosclerotic calcification is noted ?in the wall of the thoracic aorta. ?  ?Mediastinum/Nodes: No mediastinal lymphadenopathy. There is no hilar ?lymphadenopathy. The esophagus has normal imaging features. There is ?no axillary lymphadenopathy. ?  ?Lungs/Pleura: Subtle ground-glass nodularity in tree-in-bud ?configuration noted right upper lobe posteriorly (see image 53/3), ?new in the interval. No new suspicious pulmonary nodule or mass. No ?focal airspace consolidation. No pleural effusion. Paraspinal ?subpleural scarring is stable. ?  ?Musculoskeletal: No worrisome lytic or sclerotic osseous ?abnormality. ?  ?CT ABDOMEN PELVIS FINDINGS ?  ?Hepatobiliary: No suspicious focal abnormality within the liver ?parenchyma. There is no evidence for gallstones, gallbladder wall ?thickening, or pericholecystic fluid. No intrahepatic or ?extrahepatic biliary dilation. ?  ?Pancreas: No focal mass lesion. No dilatation of the main duct. No ?intraparenchymal cyst. No peripancreatic edema. ?  ?Spleen: No splenomegaly. No focal mass lesion. ?  ?Adrenals/Urinary Tract: No adrenal nodule or mass. Central sinus ?cysts noted in both kidneys. No evidence for  hydroureter. Mild ?bladder wall thickening noted anteriorly, possibly accentuated by ?underdistention, but thickening appears progressive in the interval. ?  ?Stomach/Bowel: Stomach is unremarkable. No Laray Anger

## 2021-08-13 ENCOUNTER — Other Ambulatory Visit (HOSPITAL_COMMUNITY): Payer: Self-pay

## 2021-08-25 ENCOUNTER — Encounter: Payer: Self-pay | Admitting: Urology

## 2021-08-25 ENCOUNTER — Ambulatory Visit (INDEPENDENT_AMBULATORY_CARE_PROVIDER_SITE_OTHER): Payer: Medicare Other | Admitting: Urology

## 2021-08-25 VITALS — BP 194/70 | HR 76 | Ht 65.0 in | Wt 167.0 lb

## 2021-08-25 DIAGNOSIS — N3289 Other specified disorders of bladder: Secondary | ICD-10-CM | POA: Diagnosis not present

## 2021-08-25 DIAGNOSIS — Z191 Hormone sensitive malignancy status: Secondary | ICD-10-CM | POA: Diagnosis not present

## 2021-08-25 DIAGNOSIS — C61 Malignant neoplasm of prostate: Secondary | ICD-10-CM

## 2021-08-25 LAB — URINALYSIS, COMPLETE
Bilirubin, UA: NEGATIVE
Glucose, UA: NEGATIVE
Ketones, UA: NEGATIVE
Leukocytes,UA: NEGATIVE
Nitrite, UA: NEGATIVE
RBC, UA: NEGATIVE
Specific Gravity, UA: 1.02 (ref 1.005–1.030)
Urobilinogen, Ur: 0.2 mg/dL (ref 0.2–1.0)
pH, UA: 5.5 (ref 5.0–7.5)

## 2021-08-25 LAB — MICROSCOPIC EXAMINATION
Bacteria, UA: NONE SEEN
Epithelial Cells (non renal): NONE SEEN /hpf (ref 0–10)

## 2021-08-25 NOTE — Progress Notes (Signed)
   08/25/21 CC:  Chief Complaint  Patient presents with   Cysto    HPI: Alexis Cruz is a 86 y.o. male with a personal history of bladder wall thickening, prostate cancer, and weak urinary stream who presents today foa a diagnostic cystoscopy.   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   Head CT scan showed widespread bony metastatic disease with pathologic fracture of the left acetabulum.  Pathologic fracture of the T3 vertebral body with 50% loss of height possible fracture of the left seventh rib.  No evidence of visceral metastases.  PSA elevated at 35.  Bone biopsy consistent with prostate adenocarcinoma.  Unable to obtain NGS testing on bone sample   He received firmagon and is currently on Zytiga. He followed up with [facility] for a second opinion and they recommended bisphosponated in light of pathologic fracture of the acetabalum.  He underwent a chest ct abdomen and pelvis on 04/30/2021 that visualized . no evidence for soft tissue metastatic disease in the chest, abdomen, or pelvis mild bladder wall thickening anteriorly, possibly accentuated by underdistention, but thickening appears progressive in the interval.   NM bone scan on 04/30/2021 visualized slight increased uptake in the right superior sacrum which could represent mild metastatic worsening in this location.   Vitals:   08/25/21 1104  BP: (!) 194/70  Pulse: 76   NED. A&Ox3.   No respiratory distress   Abd soft, NT, ND Normal phallus with bilateral descended testicles  Cystoscopy Procedure Note  Patient identification was confirmed, informed consent was obtained, and patient was prepped using Betadine solution.  Lidocaine jelly was administered per urethral meatus.     Pre-Procedure: - Inspection reveals a normal caliber ureteral meatus.  Procedure: The flexible cystoscope was introduced without difficulty - No urethral  strictures/lesions are present. -  Fairly open prostate  prostate  - Normal bladder neck small narrow diverticulum on posterior bladder wall and wide mouth diverticulum at the dome  - Bilateral ureteral orifices identified - Bladder mucosa  reveals no ulcers, tumors, or lesions - No bladder stones - mild trabeculation  Retroflexion shows  diverticulum at dome and poster bladder wall   Post-Procedure: - Patient tolerated the procedure well   Assessment/ Plan:  Bladder wall thickening  - No significant cystoscopy finding  - Minimally symptomatic from an outlet obstruction standpoint he is not interested at this time in treatment.  - Follow-up as needed   [name] as a scribe for [name], MD.,have documented all relevant documentation on the behalf of [name], MD,as directed by  [name], MD while in the presence of [name], MD.  I have reviewed the above documentation for accuracy and completeness, and I agree with the above.   [name], MD

## 2021-09-03 ENCOUNTER — Other Ambulatory Visit (HOSPITAL_COMMUNITY): Payer: Self-pay

## 2021-09-08 ENCOUNTER — Other Ambulatory Visit (HOSPITAL_COMMUNITY): Payer: Self-pay

## 2021-09-20 ENCOUNTER — Other Ambulatory Visit: Payer: Self-pay | Admitting: Oncology

## 2021-09-20 DIAGNOSIS — C61 Malignant neoplasm of prostate: Secondary | ICD-10-CM

## 2021-09-24 ENCOUNTER — Other Ambulatory Visit (INDEPENDENT_AMBULATORY_CARE_PROVIDER_SITE_OTHER): Payer: Self-pay | Admitting: Oncology

## 2021-09-24 DIAGNOSIS — C61 Malignant neoplasm of prostate: Secondary | ICD-10-CM

## 2021-09-24 DIAGNOSIS — Z191 Hormone sensitive malignancy status: Secondary | ICD-10-CM

## 2021-09-25 ENCOUNTER — Telehealth: Payer: Self-pay

## 2021-09-25 NOTE — Telephone Encounter (Signed)
Patient wife called in regards to patient needing a refill for his prednisone which he ran out of on 7/2.

## 2021-09-25 NOTE — Telephone Encounter (Signed)
Patient spouse called in regards to patient not having prednisone for the last "Several days".

## 2021-09-27 ENCOUNTER — Encounter: Payer: Self-pay | Admitting: Oncology

## 2021-09-27 MED ORDER — PREDNISONE 5 MG PO TABS: 5.0000 mg | ORAL_TABLET | Freq: Every day | ORAL | 0 refills | Status: DC

## 2021-09-28 ENCOUNTER — Other Ambulatory Visit: Payer: Self-pay

## 2021-09-28 DIAGNOSIS — Z191 Hormone sensitive malignancy status: Secondary | ICD-10-CM

## 2021-09-28 MED ORDER — PREDNISONE 5 MG PO TABS: 5.0000 mg | ORAL_TABLET | Freq: Every day | ORAL | 0 refills | Status: DC

## 2021-09-29 ENCOUNTER — Other Ambulatory Visit (HOSPITAL_COMMUNITY): Payer: Self-pay

## 2021-09-29 ENCOUNTER — Encounter: Payer: Self-pay | Admitting: Oncology

## 2021-10-08 ENCOUNTER — Other Ambulatory Visit (HOSPITAL_COMMUNITY): Payer: Self-pay

## 2021-10-20 ENCOUNTER — Inpatient Hospital Stay (HOSPITAL_BASED_OUTPATIENT_CLINIC_OR_DEPARTMENT_OTHER): Payer: Medicare Other | Admitting: Oncology

## 2021-10-20 ENCOUNTER — Inpatient Hospital Stay: Payer: Medicare Other

## 2021-10-20 ENCOUNTER — Encounter: Payer: Self-pay | Admitting: Oncology

## 2021-10-20 ENCOUNTER — Inpatient Hospital Stay: Payer: Medicare Other | Attending: Oncology

## 2021-10-20 VITALS — BP 157/81 | HR 70 | Temp 98.0°F | Resp 18 | Wt 168.5 lb

## 2021-10-20 DIAGNOSIS — Z79899 Other long term (current) drug therapy: Secondary | ICD-10-CM

## 2021-10-20 DIAGNOSIS — Z5111 Encounter for antineoplastic chemotherapy: Secondary | ICD-10-CM | POA: Diagnosis present

## 2021-10-20 DIAGNOSIS — Z9189 Other specified personal risk factors, not elsewhere classified: Secondary | ICD-10-CM | POA: Diagnosis not present

## 2021-10-20 DIAGNOSIS — C7951 Secondary malignant neoplasm of bone: Secondary | ICD-10-CM | POA: Insufficient documentation

## 2021-10-20 DIAGNOSIS — Z5181 Encounter for therapeutic drug level monitoring: Secondary | ICD-10-CM | POA: Diagnosis not present

## 2021-10-20 DIAGNOSIS — C61 Malignant neoplasm of prostate: Secondary | ICD-10-CM | POA: Insufficient documentation

## 2021-10-20 DIAGNOSIS — M8580 Other specified disorders of bone density and structure, unspecified site: Secondary | ICD-10-CM | POA: Diagnosis not present

## 2021-10-20 DIAGNOSIS — Z191 Hormone sensitive malignancy status: Secondary | ICD-10-CM

## 2021-10-20 DIAGNOSIS — Z79818 Long term (current) use of other agents affecting estrogen receptors and estrogen levels: Secondary | ICD-10-CM

## 2021-10-20 LAB — CBC WITH DIFFERENTIAL/PLATELET
Abs Immature Granulocytes: 0.05 10*3/uL (ref 0.00–0.07)
Basophils Absolute: 0 10*3/uL (ref 0.0–0.1)
Basophils Relative: 0 %
Eosinophils Absolute: 0.1 10*3/uL (ref 0.0–0.5)
Eosinophils Relative: 1 %
HCT: 35.7 % — ABNORMAL LOW (ref 39.0–52.0)
Hemoglobin: 11.8 g/dL — ABNORMAL LOW (ref 13.0–17.0)
Immature Granulocytes: 1 %
Lymphocytes Relative: 10 %
Lymphs Abs: 1 10*3/uL (ref 0.7–4.0)
MCH: 30.1 pg (ref 26.0–34.0)
MCHC: 33.1 g/dL (ref 30.0–36.0)
MCV: 91.1 fL (ref 80.0–100.0)
Monocytes Absolute: 0.8 10*3/uL (ref 0.1–1.0)
Monocytes Relative: 8 %
Neutro Abs: 7.9 10*3/uL — ABNORMAL HIGH (ref 1.7–7.7)
Neutrophils Relative %: 80 %
Platelets: 185 10*3/uL (ref 150–400)
RBC: 3.92 MIL/uL — ABNORMAL LOW (ref 4.22–5.81)
RDW: 14.1 % (ref 11.5–15.5)
WBC: 9.9 10*3/uL (ref 4.0–10.5)
nRBC: 0 % (ref 0.0–0.2)

## 2021-10-20 LAB — COMPREHENSIVE METABOLIC PANEL
ALT: 12 U/L (ref 0–44)
AST: 23 U/L (ref 15–41)
Albumin: 4.3 g/dL (ref 3.5–5.0)
Alkaline Phosphatase: 84 U/L (ref 38–126)
Anion gap: 8 (ref 5–15)
BUN: 22 mg/dL (ref 8–23)
CO2: 25 mmol/L (ref 22–32)
Calcium: 8.9 mg/dL (ref 8.9–10.3)
Chloride: 103 mmol/L (ref 98–111)
Creatinine, Ser: 1.09 mg/dL (ref 0.61–1.24)
GFR, Estimated: >60 mL/min (ref >60)
Glucose, Bld: 115 mg/dL — ABNORMAL HIGH (ref 70–99)
Potassium: 4.5 mmol/L (ref 3.5–5.1)
Sodium: 136 mmol/L (ref 135–145)
Total Bilirubin: 0.8 mg/dL (ref 0.3–1.2)
Total Protein: 7.4 g/dL (ref 6.5–8.1)

## 2021-10-20 LAB — PSA: Prostatic Specific Antigen: <0.01 ng/mL (ref 0.00–4.00)

## 2021-10-20 NOTE — Progress Notes (Signed)
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 Posey Pronto for symptoms of left hip pain which prompted x-rays followed by MRI of the left hip without contrast.  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  Patient was subsequently seen by orthopedics Dr. Pathology who did not recommend any orthopedic intervention for the left hip fracture he has been referred to oncology for further management patient's last PSA was checked in 2014 which was elevated at 7.4.  Patient is also a chronic smoker and smokes about 1 pack of cigarettes per day since 1968.   Head CT scan showed widespread bony metastatic disease with pathologic fracture of the left acetabulum.  Pathologic fracture of the T3 vertebral body with 50% loss of height possible fracture of the left seventh rib.  No evidence of visceral metastases.  PSA elevated at 35.  Bone biopsy consistent with prostate adenocarcinoma.  Unable to obtain NGS testing on bone sample   Patient received Mills Koller and is currently on Zytiga   Patient also seen at [facility] for second opinion related with [facility] and ADT.  They recommended bisphosphonates given pathologic fracture of the acetabulum.  However patient did not wish to take it given the risk of osteonecrosis of  the jaw      Interval history-patient is doing well for his age.  He has had 1 or 2 falls since his last visit.  He has some cognitive decline but overall has remained stable.  Denies any new pain  ECOG PS- 1 Pain scale- 0   Review of systems- Review of Systems  Constitutional:  Positive for malaise/fatigue. Negative for chills, fever and weight loss.  HENT:  Negative for congestion, ear discharge and nosebleeds.   Eyes:  Negative for blurred vision.  Respiratory:  Negative for cough, hemoptysis, sputum production, shortness of breath and wheezing.   Cardiovascular:  Negative for chest pain, palpitations, orthopnea and claudication.  Gastrointestinal:  Negative for abdominal pain, blood in stool, constipation, diarrhea, heartburn, melena, nausea and vomiting.  Genitourinary:  Negative for dysuria, flank pain, frequency, hematuria and urgency.  Musculoskeletal:  Negative for back pain, joint pain and myalgias.  Skin:  Negative for rash.  Neurological:  Negative for dizziness, tingling, focal weakness, seizures, weakness and headaches.  Endo/Heme/Allergies:  Does not bruise/bleed easily.  Psychiatric/Behavioral:  Negative for depression and suicidal ideas. The patient does not have insomnia.       Allergies  Allergen Reactions   Citalopram Other (See Comments)    Unsure but family remembers that he could not take the dru-not sure what happened     Past Medical History:  Diagnosis Date   Hypertension    Prostate cancer ([place])      No past surgical history on file.  Social History   Socioeconomic History   Marital status: Married    Spouse name: Not on file   Number of  children: Not on file   Years of education: Not on file   Highest education level: Not on file  Occupational History   Not on file  Tobacco Use   Smoking status: Former    Types: Pipe   Smokeless tobacco: Former   Tobacco comments:    stopped pipes 3 month ago  Vaping Use   Vaping Use: Never used   Substance and Sexual Activity   Alcohol use: Not Currently   Drug use: Never   Sexual activity: Not on file  Other Topics Concern   Not on file  Social History Narrative   Not on file   Social Determinants of Health   Financial Resource Strain: Not on file  Food Insecurity: Not on file  Transportation Needs: Not on file  Physical Activity: Not on file  Stress: Not on file  Social Connections: Not on file  Intimate Partner Violence: Not on file    No family history on file.   Current Outpatient Medications:    abiraterone acetate (ZYTIGA) 250 MG tablet, TAKE 4 TABLETS (1,000 MG TOTAL) BY MOUTH DAILY. TAKE ON AN EMPTY STOMACH 1 HOUR BEFORE OR 2 HOURS AFTER A MEAL, Disp: 120 tablet, Rfl: 3   Cholecalciferol 125 MCG (5000 UT) capsule, Take 5,000 Units by mouth once a week., Disp: , Rfl:    cyanocobalamin 1000 MCG tablet, Take by mouth., Disp: , Rfl:    donepezil (ARICEPT) 5 MG tablet, Take by mouth., Disp: , Rfl:    lisinopril (ZESTRIL) 20 MG tablet, Take 20 mg by mouth daily., Disp: , Rfl:    Omega-3 Fatty Acids (FISH OIL) 1200 MG CAPS, Take 1,200 mg by mouth daily., Disp: , Rfl:    predniSONE (DELTASONE) 5 MG tablet, Take 1 tablet by mouth once daily with breakfast, Disp: 90 tablet, Rfl: 0   zinc gluconate 50 MG tablet, Take 50 mg by mouth daily., Disp: , Rfl:    Docusate Sodium (DSS) 100 MG CAPS, Take by mouth. (Patient not taking: Reported on 10/20/2021), Disp: , Rfl:    senna (SENOKOT) 8.6 MG tablet, Take by mouth. (Patient not taking: Reported on 10/20/2021), Disp: , Rfl:  No current facility-administered medications for this visit.  Facility-Administered Medications Ordered in Other Visits:    Leuprolide Acetate (3 Month) (ELIGARD) 22.5 MG injection 22.5 mg, 22.5 mg, Subcutaneous, Q90 days, Sindy Guadeloupe, MD, 22.5 mg at 05/06/20 1116   Leuprolide Acetate (3 Month) (ELIGARD) 22.5 MG injection 22.5 mg, 22.5 mg, Subcutaneous, Q90 days, Sindy Guadeloupe, MD, 22.5 mg at 08/04/20  1044  Physical exam:  Vitals:   10/20/21 1305  Resp: 18  Temp: 98 F (36.7 C)  Weight: 168 lb 8 oz (76.4 kg)   Physical Exam Constitutional:      General: He is not in acute distress. Cardiovascular:     Rate and Rhythm: Normal rate and regular rhythm.     Heart sounds: Normal heart sounds.  Pulmonary:     Effort: Pulmonary effort is normal.     Breath sounds: Normal breath sounds.  Abdominal:     General: Bowel sounds are normal.     Palpations: Abdomen is soft.  Skin:    General: Skin is warm and dry.  Neurological:     Mental Status: He is alert and oriented to person, place, and time.         Latest Ref Rng & Units 10/20/2021   12:35 PM  CMP  Glucose 70 - 99 mg/dL 115  BUN 8 - 23 mg/dL 22   Creatinine 0.61 - 1.24 mg/dL 1.09   Sodium 135 - 145 mmol/L 136   Potassium 3.5 - 5.1 mmol/L 4.5   Chloride 98 - 111 mmol/L 103   CO2 22 - 32 mmol/L 25   Calcium 8.9 - 10.3 mg/dL 8.9   Total Protein 6.5 - 8.1 g/dL 7.4   Total Bilirubin 0.3 - 1.2 mg/dL 0.8   Alkaline Phos 38 - 126 U/L 84   AST 15 - 41 U/L 23   ALT 0 - 44 U/L 12       Latest Ref Rng & Units 10/20/2021   12:35 PM  CBC  WBC 4.0 - 10.5 K/uL 9.9   Hemoglobin 13.0 - 17.0 g/dL 11.8   Hematocrit 39.0 - 52.0 % 35.7   Platelets 150 - 400 K/uL 185     Assessment and plan- Patient is a 86 y.o. male with history of metastatic castrate sensitive prostate cancer on Lupron and Zytiga here for routine follow-up:  Castrate sensitive prostate cancer: He will receive Lupron around 11/01/2021.Also continue Zytiga.  PSA remains undetectable.  PSA from today is pending.  I will plan to repeat CT chest abdomen and pelvis with contrast and bone scan in about 3 months.  He had nonspecific inflammatory findings in his lung based on his CT in February 2023 which I will also follow-up on.  Typically bisphosphonates are not recommended in castrate sensitive disease.  I had discussed this prior to initiate bisphosphonate given his  pathologic fracture but patient did not want to take it given the risk of osteonecrosis of the jaw.  I am getting a bone density scan at this time to assess his bone health given that he has been on prolonged ADT.  There is evidence of worsening osteopenia or osteoporosis I will discuss considering bisphosphonates every 6 months.  I will see him back in 3 months with CBC with differential CMP and PSA   Visit Diagnosis 1. Metastatic castration-sensitive adenocarcinoma of prostate ([place])   2. High risk medication use   3. At risk for bone density loss   4. Encounter for monitoring Lupron therapy      Dr. Randa Evens, MD, MPH [facility] at [facility] [phone] 10/20/2021 1:10 PM

## 2021-10-27 ENCOUNTER — Other Ambulatory Visit: Payer: Self-pay | Admitting: Oncology

## 2021-10-27 ENCOUNTER — Other Ambulatory Visit (HOSPITAL_COMMUNITY): Payer: Self-pay

## 2021-10-27 DIAGNOSIS — C61 Malignant neoplasm of prostate: Secondary | ICD-10-CM

## 2021-10-27 NOTE — Telephone Encounter (Signed)
CBC with Differential/Platelet Order: [id number] Status: Final result    Visible to patient: Yes (seen)    Next appt: 11/03/2021 at 01:30 PM in Oncology ([facility] IN[facility])    Dx: Metastatic castration-sensitive adeno...    0 Result Notes           Component Ref Range & Units 7 d ago (10/20/21) 3 mo ago (07/21/21) 6 mo ago (04/30/21) 8 mo ago (02/06/21) 12 mo ago (10/29/20) 1 yr ago (08/04/20) 1 yr ago (05/06/20)  WBC 4.0 - 10.5 K/uL 9.9  5.8  6.1  7.1  6.5  6.7  8.2   RBC 4.22 - 5.81 MIL/uL 3.92 Low   3.35 Low   3.31 Low   3.56 Low   3.37 Low   3.45 Low   3.11 Low    Hemoglobin 13.0 - 17.0 g/dL 11.8 Low   10.4 Low   10.1 Low   11.0 Low   10.5 Low   10.9 Low   9.7 Low    HCT 39.0 - 52.0 % 35.7 Low   31.5 Low   29.9 Low   32.6 Low   31.4 Low   32.1 Low   29.1 Low    MCV 80.0 - 100.0 fL 91.1  94.0  90.3  91.6  93.2  93.0  93.6   MCH 26.0 - 34.0 pg 30.1  31.0  30.5  30.9  31.2  31.6  31.2   MCHC 30.0 - 36.0 g/dL 33.1  33.0  33.8  33.7  33.4  34.0  33.3   RDW 11.5 - 15.5 % 14.1  15.2  14.3  14.1  14.4  14.6  17.0 High    Platelets 150 - 400 K/uL 185  154  164  191  190  184  206   nRBC 0.0 - 0.2 % 0.0  0.0  0.0  0.0  0.0  0.0  0.0   Neutrophils Relative % % 80  57  55  58  78  57  74   Neutro Abs 1.7 - 7.7 K/uL 7.9 High   3.3  3.4  4.1  5.2  3.8  6.1   Lymphocytes Relative % '10  24  21  21  13  21  14   '$ Lymphs Abs 0.7 - 4.0 K/uL 1.0  1.4  1.3  1.5  0.8  1.4  1.2   Monocytes Relative % '8  17  21  19  7  19  9   '$ Monocytes Absolute 0.1 - 1.0 K/uL 0.8  1.0  1.3 High   1.3 High   0.4  1.3 High   0.7   Eosinophils Relative % '1  1  2  1  1  2  2   '$ Eosinophils Absolute 0.0 - 0.5 K/uL 0.1  0.1  0.1  0.1  0.0  0.1  0.1   Basophils Relative % 0  0  0  0  0  0  0   Basophils Absolute 0.0 - 0.1 K/uL 0.0  0.0  0.0  0.0  0.0  0.0  0.0   Immature Granulocytes % '1  1  1  1  1  1  1   '$ Abs Immature Granulocytes 0.00 - 0.07 K/uL 0.05  0.05 CM  0.03 CM  0.05 CM  0.03 CM  0.04 CM  0.08 High  CM   Comment:  Performed at [facility], [address] [place], [place] [address]  Resulting Agency  [place] [facility] [place] [facility] [place] [facility] [place] [facility] [place] [facility] [place] [facility] [place] [facility]         Specimen Collected: 10/20/21 12:35 Last Resulted: 10/20/21 12:46      Lab Flowsheet    Order Details    View Encounter    Lab and Collection Details    Routing    Result History    View All Conversations on this Encounter      CM=Additional comments      Result Care Coordination   Patient Communication   Add Comments   Seen Back to Top       Other Results from 10/20/2021   Contains abnormal data Comprehensive metabolic panel Order: [id number] Status: Final result    Visible to patient: Yes (seen)    Next appt: 11/03/2021 at 01:30 PM in Oncology ([facility] IN[facility])    Dx: Metastatic castration-sensitive adeno...    0 Result Notes           Component Ref Range & Units 7 d ago (10/20/21) 3 mo ago (07/21/21) 6 mo ago (04/30/21) 6 mo ago (04/30/21) 8 mo ago (02/06/21) 12 mo ago (10/29/20) 1 yr ago (08/04/20)  Sodium 135 - 145 mmol/L 136  132 Low    131 Low   134 Low   130 Low   133 Low    Potassium 3.5 - 5.1 mmol/L 4.5  4.0   3.4 Low   3.7  4.4  3.9   Chloride 98 - 111 mmol/L 103  103   95 Low   99  95 Low   97 Low    CO2 22 - 32 mmol/L '25  23   26  25  26  25   '$ Glucose, Bld 70 - 99 mg/dL 115 High   98 CM   99 CM  109 High  CM  114 High  CM  105 High  CM   Comment: Glucose reference range applies only to samples taken after fasting for at least 8 hours.  BUN 8 - 23 mg/dL '22  22   20  27 '$ High   32 High   28 High    Creatinine, Ser 0.61 - 1.24 mg/dL 1.09  1.12  1.10  1.01  1.15  1.26 High   1.11   Calcium 8.9 - 10.3 mg/dL 8.9  9.0   9.2  9.2  9.3  9.5   Total Protein 6.5 - 8.1 g/dL 7.4  7.0   7.0  7.7  7.6  7.5   Albumin 3.5 - 5.0 g/dL 4.3  4.0   3.9  4.2  4.4  4.1   AST 15 - 41 U/L '23  22   20  25  23  24   '$ ALT 0 - 44 U/L '12  11   11  14  12  14   '$ Alkaline Phosphatase 38 - 126 U/L 84  68   70   77  69  73   Total Bilirubin 0.3 - 1.2 mg/dL 0.8  0.8   0.5  0.5  1.0  1.1   GFR, Estimated >60 mL/min >60  >60 CM   >60 CM  >60 CM  57 Low  CM  >60 CM   Comment: (NOTE)  Calculated using the CKD-EPI Creatinine Equation (2021)   Anion gap 5 - '15 8  6 '$ CM   10 CM  10 CM  9 CM  11 CM   Comment:  Performed at [facility], [address] [place], [place] [address]  Resulting Agency  [facility] [place] [facility] [place] [facility] [place] [facility] [place] [facility]         Specimen Collected: 10/20/21 12:35 Last Resulted: 10/20/21 12:57

## 2021-10-29 ENCOUNTER — Other Ambulatory Visit (HOSPITAL_COMMUNITY): Payer: Self-pay

## 2021-10-29 ENCOUNTER — Encounter: Payer: Self-pay | Admitting: Oncology

## 2021-10-29 MED ORDER — ABIRATERONE ACETATE 250 MG PO TABS
ORAL_TABLET | Freq: Every day | ORAL | 3 refills | Status: DC
  Filled 2021-10-29: qty 120, 30d supply, fill #0
  Filled 2021-11-25: qty 120, 30d supply, fill #1
  Filled 2022-01-01: qty 120, 30d supply, fill #2
  Filled 2022-02-02: qty 120, 30d supply, fill #3

## 2021-11-03 ENCOUNTER — Inpatient Hospital Stay: Payer: Medicare Other

## 2021-11-03 DIAGNOSIS — Z191 Hormone sensitive malignancy status: Secondary | ICD-10-CM

## 2021-11-03 DIAGNOSIS — Z5111 Encounter for antineoplastic chemotherapy: Secondary | ICD-10-CM | POA: Diagnosis not present

## 2021-11-03 MED ORDER — LEUPROLIDE ACETATE (6 MONTH) 45 MG ~~LOC~~ KIT
45.0000 mg | PACK | Freq: Once | SUBCUTANEOUS | Status: AC
  Administered 2021-11-03: 45 mg via SUBCUTANEOUS
  Filled 2021-11-03: qty 45

## 2021-11-05 ENCOUNTER — Other Ambulatory Visit (HOSPITAL_COMMUNITY): Payer: Self-pay

## 2021-11-25 ENCOUNTER — Other Ambulatory Visit (HOSPITAL_COMMUNITY): Payer: Self-pay

## 2021-12-07 ENCOUNTER — Other Ambulatory Visit (HOSPITAL_COMMUNITY): Payer: Self-pay

## 2021-12-29 ENCOUNTER — Ambulatory Visit
Admission: RE | Admit: 2021-12-29 | Discharge: 2021-12-29 | Disposition: A | Discharge status: Home / Self Care | Payer: Medicare Other | Source: Ambulatory Visit | Attending: Oncology | Admitting: Oncology

## 2021-12-29 DIAGNOSIS — C61 Malignant neoplasm of prostate: Secondary | ICD-10-CM | POA: Diagnosis present

## 2021-12-29 DIAGNOSIS — Z79899 Other long term (current) drug therapy: Secondary | ICD-10-CM | POA: Insufficient documentation

## 2021-12-29 DIAGNOSIS — Z191 Hormone sensitive malignancy status: Secondary | ICD-10-CM | POA: Diagnosis present

## 2021-12-29 DIAGNOSIS — Z5181 Encounter for therapeutic drug level monitoring: Secondary | ICD-10-CM | POA: Diagnosis present

## 2021-12-29 DIAGNOSIS — Z79818 Long term (current) use of other agents affecting estrogen receptors and estrogen levels: Secondary | ICD-10-CM | POA: Insufficient documentation

## 2021-12-29 DIAGNOSIS — Z9189 Other specified personal risk factors, not elsewhere classified: Secondary | ICD-10-CM | POA: Insufficient documentation

## 2022-01-01 ENCOUNTER — Other Ambulatory Visit (HOSPITAL_COMMUNITY): Payer: Self-pay

## 2022-01-07 ENCOUNTER — Encounter: Payer: Self-pay | Admitting: Oncology

## 2022-01-07 ENCOUNTER — Other Ambulatory Visit (HOSPITAL_COMMUNITY): Payer: Self-pay

## 2022-01-13 ENCOUNTER — Encounter
Admission: RE | Admit: 2022-01-13 | Discharge: 2022-01-13 | Disposition: A | Discharge status: Home / Self Care | Payer: Medicare Other | Source: Ambulatory Visit | Attending: Oncology | Admitting: Oncology

## 2022-01-13 ENCOUNTER — Ambulatory Visit
Admission: RE | Admit: 2022-01-13 | Discharge: 2022-01-13 | Disposition: A | Discharge status: Home / Self Care | Payer: Medicare Other | Source: Ambulatory Visit | Attending: Oncology | Admitting: Oncology

## 2022-01-13 DIAGNOSIS — Z191 Hormone sensitive malignancy status: Secondary | ICD-10-CM | POA: Insufficient documentation

## 2022-01-13 DIAGNOSIS — C61 Malignant neoplasm of prostate: Secondary | ICD-10-CM | POA: Insufficient documentation

## 2022-01-13 LAB — POCT I-STAT CREATININE: Creatinine, Ser: 1.2 mg/dL (ref 0.61–1.24)

## 2022-01-13 MED ORDER — TECHNETIUM TC 99M MEDRONATE IV KIT
20.0000 | PACK | Freq: Once | INTRAVENOUS | Status: AC | PRN
  Administered 2022-01-13: 20.21 via INTRAVENOUS

## 2022-01-13 MED ORDER — IOHEXOL 300 MG/ML  SOLN
100.0000 mL | Freq: Once | INTRAMUSCULAR | Status: AC | PRN
  Administered 2022-01-13: 100 mL via INTRAVENOUS

## 2022-01-18 ENCOUNTER — Other Ambulatory Visit: Payer: Self-pay | Admitting: *Deleted

## 2022-01-18 DIAGNOSIS — Z79899 Other long term (current) drug therapy: Secondary | ICD-10-CM

## 2022-01-18 DIAGNOSIS — Z191 Hormone sensitive malignancy status: Secondary | ICD-10-CM

## 2022-01-20 ENCOUNTER — Encounter: Payer: Self-pay | Admitting: Oncology

## 2022-01-20 ENCOUNTER — Inpatient Hospital Stay: Payer: Medicare Other | Attending: Oncology

## 2022-01-20 ENCOUNTER — Inpatient Hospital Stay (HOSPITAL_BASED_OUTPATIENT_CLINIC_OR_DEPARTMENT_OTHER): Payer: Medicare Other | Admitting: Oncology

## 2022-01-20 VITALS — BP 182/74 | HR 70 | Temp 97.3°F | Resp 16 | Wt 173.2 lb

## 2022-01-20 DIAGNOSIS — M80052D Age-related osteoporosis with current pathological fracture, left femur, subsequent encounter for fracture with routine healing: Secondary | ICD-10-CM | POA: Diagnosis not present

## 2022-01-20 DIAGNOSIS — M810 Age-related osteoporosis without current pathological fracture: Secondary | ICD-10-CM | POA: Diagnosis not present

## 2022-01-20 DIAGNOSIS — F17210 Nicotine dependence, cigarettes, uncomplicated: Secondary | ICD-10-CM | POA: Diagnosis not present

## 2022-01-20 DIAGNOSIS — Z79899 Other long term (current) drug therapy: Secondary | ICD-10-CM

## 2022-01-20 DIAGNOSIS — Z191 Hormone sensitive malignancy status: Secondary | ICD-10-CM

## 2022-01-20 DIAGNOSIS — C61 Malignant neoplasm of prostate: Secondary | ICD-10-CM | POA: Insufficient documentation

## 2022-01-20 DIAGNOSIS — C7951 Secondary malignant neoplasm of bone: Secondary | ICD-10-CM | POA: Insufficient documentation

## 2022-01-20 LAB — COMPREHENSIVE METABOLIC PANEL
ALT: 13 U/L (ref 0–44)
AST: 23 U/L (ref 15–41)
Albumin: 4.2 g/dL (ref 3.5–5.0)
Alkaline Phosphatase: 75 U/L (ref 38–126)
Anion gap: 7 (ref 5–15)
BUN: 25 mg/dL — ABNORMAL HIGH (ref 8–23)
CO2: 23 mmol/L (ref 22–32)
Calcium: 9.3 mg/dL (ref 8.9–10.3)
Chloride: 106 mmol/L (ref 98–111)
Creatinine, Ser: 1.04 mg/dL (ref 0.61–1.24)
GFR, Estimated: >60 mL/min (ref >60)
Glucose, Bld: 106 mg/dL — ABNORMAL HIGH (ref 70–99)
Potassium: 3.8 mmol/L (ref 3.5–5.1)
Sodium: 136 mmol/L (ref 135–145)
Total Bilirubin: 0.8 mg/dL (ref 0.3–1.2)
Total Protein: 7.8 g/dL (ref 6.5–8.1)

## 2022-01-20 LAB — CBC WITH DIFFERENTIAL/PLATELET
Abs Immature Granulocytes: 0.06 10*3/uL (ref 0.00–0.07)
Basophils Absolute: 0 10*3/uL (ref 0.0–0.1)
Basophils Relative: 0 %
Eosinophils Absolute: 0.2 10*3/uL (ref 0.0–0.5)
Eosinophils Relative: 2 %
HCT: 37.5 % — ABNORMAL LOW (ref 39.0–52.0)
Hemoglobin: 12 g/dL — ABNORMAL LOW (ref 13.0–17.0)
Immature Granulocytes: 1 %
Lymphocytes Relative: 20 %
Lymphs Abs: 1.3 10*3/uL (ref 0.7–4.0)
MCH: 29.3 pg (ref 26.0–34.0)
MCHC: 32 g/dL (ref 30.0–36.0)
MCV: 91.7 fL (ref 80.0–100.0)
Monocytes Absolute: 1.1 10*3/uL — ABNORMAL HIGH (ref 0.1–1.0)
Monocytes Relative: 16 %
Neutro Abs: 4 10*3/uL (ref 1.7–7.7)
Neutrophils Relative %: 61 %
Platelets: 205 10*3/uL (ref 150–400)
RBC: 4.09 MIL/uL — ABNORMAL LOW (ref 4.22–5.81)
RDW: 14.6 % (ref 11.5–15.5)
WBC: 6.7 10*3/uL (ref 4.0–10.5)
nRBC: 0 % (ref 0.0–0.2)

## 2022-01-20 LAB — PSA: Prostatic Specific Antigen: <0.01 ng/mL (ref 0.00–4.00)

## 2022-01-20 MED ORDER — ALENDRONATE SODIUM 70 MG PO TABS: 70.0000 mg | ORAL_TABLET | ORAL | 0 refills | Status: AC

## 2022-01-20 NOTE — Progress Notes (Signed)
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 Posey Pronto for symptoms of left hip pain which prompted x-rays followed by MRI of the left hip without contrast.  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  Patient was subsequently seen by orthopedics Dr. Pathology who did not recommend any orthopedic intervention for the left hip fracture he has been referred to oncology for further management patient's last PSA was checked in 2014 which was elevated at 7.4.  Patient is also a chronic smoker and smokes about 1 pack of cigarettes per day since 1968.   Head CT scan showed widespread bony metastatic disease with pathologic fracture of the left acetabulum.  Pathologic fracture of the T3 vertebral body with 50% loss of height possible fracture of the left seventh rib.  No evidence of visceral metastases.  PSA elevated at 35.  Bone biopsy consistent with prostate adenocarcinoma.  Unable to obtain NGS testing on bone sample   Patient received Mills Koller and is currently on Zytiga   Patient also seen at [facility] for second opinion related with [facility] and ADT.  They recommended bisphosphonates given pathologic fracture of the acetabulum.  However patient did not wish to take it given the risk of osteonecrosis  of the jaw    Interval history-patient is doing well for his age.  However he is not much active and spends most of his time in a recliner.  No recent falls.  Pain is generally well controlled.  ECOG PS- 2 Pain scale- 0  Review of systems- Review of Systems  Constitutional:  Positive for malaise/fatigue. Negative for chills, fever and weight loss.  HENT:  Negative for congestion, ear discharge and nosebleeds.   Eyes:  Negative for blurred vision.  Respiratory:  Negative for cough, hemoptysis, sputum production, shortness of breath and wheezing.   Cardiovascular:  Negative for chest pain, palpitations, orthopnea and claudication.  Gastrointestinal:  Negative for abdominal pain, blood in stool, constipation, diarrhea, heartburn, melena, nausea and vomiting.  Genitourinary:  Negative for dysuria, flank pain, frequency, hematuria and urgency.  Musculoskeletal:  Negative for back pain, joint pain and myalgias.  Skin:  Negative for rash.  Neurological:  Negative for dizziness, tingling, focal weakness, seizures, weakness and headaches.  Endo/Heme/Allergies:  Does not bruise/bleed easily.  Psychiatric/Behavioral:  Negative for depression and suicidal ideas. The patient does not have insomnia.       Allergies  Allergen Reactions   Citalopram Other (See Comments)    Unsure but family remembers that he could not take the dru-not sure what happened     Past Medical History:  Diagnosis Date   Hypertension    Prostate cancer ([place])      History reviewed. No pertinent surgical history.  Social History   Socioeconomic History   Marital status: Married    Spouse name: Not on file   Number of children: Not on file  Years of education: Not on file   Highest education level: Not on file  Occupational History   Not on file  Tobacco Use   Smoking status: Former    Types: Pipe   Smokeless tobacco: Former   Tobacco comments:    stopped pipes 3 month ago  Vaping Use   Vaping Use:  Never used  Substance and Sexual Activity   Alcohol use: Not Currently   Drug use: Never   Sexual activity: Not on file  Other Topics Concern   Not on file  Social History Narrative   Not on file   Social Determinants of Health   Financial Resource Strain: Not on file  Food Insecurity: Not on file  Transportation Needs: Not on file  Physical Activity: Not on file  Stress: Not on file  Social Connections: Not on file  Intimate Partner Violence: Not on file    History reviewed. No pertinent family history.   Current Outpatient Medications:    abiraterone acetate (ZYTIGA) 250 MG tablet, TAKE 4 TABLETS (1,000 MG TOTAL) BY MOUTH DAILY. TAKE ON AN EMPTY STOMACH 1 HOUR BEFORE OR 2 HOURS AFTER A MEAL, Disp: 120 tablet, Rfl: 3   alendronate (FOSAMAX) 70 MG tablet, Take 1 tablet (70 mg total) by mouth once a week. Take with a full glass of water on an empty stomach., Disp: 12 tablet, Rfl: 0   Cholecalciferol 125 MCG (5000 UT) capsule, Take 5,000 Units by mouth once a week., Disp: , Rfl:    cyanocobalamin 1000 MCG tablet, Take by mouth., Disp: , Rfl:    donepezil (ARICEPT) 5 MG tablet, Take by mouth., Disp: , Rfl:    lisinopril (ZESTRIL) 20 MG tablet, Take 20 mg by mouth daily., Disp: , Rfl:    predniSONE (DELTASONE) 5 MG tablet, Take 1 tablet by mouth once daily with breakfast, Disp: 90 tablet, Rfl: 0   zinc gluconate 50 MG tablet, Take 50 mg by mouth daily., Disp: , Rfl:    Docusate Sodium (DSS) 100 MG CAPS, Take by mouth. (Patient not taking: Reported on 10/20/2021), Disp: , Rfl:    Omega-3 Fatty Acids (FISH OIL) 1200 MG CAPS, Take 1,200 mg by mouth daily. (Patient not taking: Reported on 01/20/2022), Disp: , Rfl:    senna (SENOKOT) 8.6 MG tablet, Take by mouth. (Patient not taking: Reported on 10/20/2021), Disp: , Rfl:  No current facility-administered medications for this visit.  Facility-Administered Medications Ordered in Other Visits:    Leuprolide Acetate (3 Month) (ELIGARD) 22.5 MG  injection 22.5 mg, 22.5 mg, Subcutaneous, Q90 days, Sindy Guadeloupe, MD, 22.5 mg at 05/06/20 1116   Leuprolide Acetate (3 Month) (ELIGARD) 22.5 MG injection 22.5 mg, 22.5 mg, Subcutaneous, Q90 days, Sindy Guadeloupe, MD, 22.5 mg at 08/04/20 1044  Physical exam:  Vitals:   01/20/22 1106  BP: (!) 182/74  Pulse: 70  Resp: 16  Temp: (!) 97.3 F (36.3 C)  SpO2: 97%  Weight: 173 lb 3.2 oz (78.6 kg)   Physical Exam Constitutional:      General: He is not in acute distress. Cardiovascular:     Rate and Rhythm: Normal rate and regular rhythm.     Heart sounds: Normal heart sounds.  Pulmonary:     Effort: Pulmonary effort is normal.     Breath sounds: Normal breath sounds.  Abdominal:     General: Bowel sounds are normal.     Palpations: Abdomen is soft.  Skin:    General: Skin is warm and  dry.  Neurological:     Mental Status: He is alert and oriented to person, place, and time.         Latest Ref Rng & Units 01/20/2022   10:25 AM  CMP  Glucose 70 - 99 mg/dL 106   BUN 8 - 23 mg/dL 25   Creatinine 0.61 - 1.24 mg/dL 1.04   Sodium 135 - 145 mmol/L 136   Potassium 3.5 - 5.1 mmol/L 3.8   Chloride 98 - 111 mmol/L 106   CO2 22 - 32 mmol/L 23   Calcium 8.9 - 10.3 mg/dL 9.3   Total Protein 6.5 - 8.1 g/dL 7.8   Total Bilirubin 0.3 - 1.2 mg/dL 0.8   Alkaline Phos 38 - 126 U/L 75   AST 15 - 41 U/L 23   ALT 0 - 44 U/L 13       Latest Ref Rng & Units 01/20/2022   10:25 AM  CBC  WBC 4.0 - 10.5 K/uL 6.7   Hemoglobin 13.0 - 17.0 g/dL 12.0   Hematocrit 39.0 - 52.0 % 37.5   Platelets 150 - 400 K/uL 205     No images are attached to the encounter.  CT CHEST ABDOMEN PELVIS W CONTRAST  Result Date: 01/15/2022 CLINICAL DATA:  Prostate cancer follow-up. Metastatic castrate resistant prostate cancer with bone metastasis. * Tracking Code: BO * EXAM: CT CHEST, ABDOMEN, AND PELVIS WITH CONTRAST TECHNIQUE: Multidetector CT imaging of the chest, abdomen and pelvis was performed following the  standard protocol during bolus administration of intravenous contrast. RADIATION DOSE REDUCTION: This exam was performed according to the departmental dose-optimization program which includes automated exposure control, adjustment of the mA and/or kV according to patient size and/or use of iterative reconstruction technique. CONTRAST:  133m OMNIPAQUE IOHEXOL 300 MG/ML  SOLN COMPARISON:  CT 04/30/2021, bone scan 04/30/2021 and 01/13/2022 FINDINGS: CT CHEST FINDINGS Cardiovascular: Coronary artery calcification and aortic atherosclerotic calcification. Mediastinum/Nodes: No axillary or supraclavicular adenopathy. No mediastinal or hilar adenopathy. No pericardial fluid. Esophagus normal. Lungs/Pleura: No suspicious pulmonary nodules. Normal pleural. Airways normal. Musculoskeletal: No aggressive osseous lesion. CT ABDOMEN AND PELVIS FINDINGS Hepatobiliary: No focal hepatic lesion. No biliary ductal dilatation. Gallbladder is normal. Common bile duct is normal. Pancreas: Pancreas is normal. No ductal dilatation. No pancreatic inflammation. Spleen: Normal spleen Adrenals/urinary tract: Adrenal glands and kidneys are normal. The ureters and bladder normal. Stomach/Bowel: Stomach, small bowel, appendix, and cecum are normal. The colon and rectosigmoid colon are normal. Vascular/Lymphatic: Abdominal aorta is normal caliber with atherosclerotic calcification. There is no retroperitoneal or periportal lymphadenopathy. No pelvic lymphadenopathy. Reproductive: Prostate unremarkable. Other: No free fluid. Musculoskeletal: No suspicious blastic or lytic lesions. IMPRESSION: Chest Impression: 1. No evidence of thoracic metastasis. 2. Coronary artery calcification and Aortic Atherosclerosis (ICD10-I70.0). Abdomen / Pelvis Impression: 1. No evidence pelvic or abdominal lymphadenopathy. 2. No evidence skeletal metastasis. Electronically Signed   By: SSuzy BouchardM.D.   On: 01/15/2022 11:32   NM Bone Scan Whole Body  Result  Date: 01/15/2022 CLINICAL DATA:  Follow-up prostate carcinoma. Recent fall onto RIGHT side. EXAM: NUCLEAR MEDICINE WHOLE BODY BONE SCAN TECHNIQUE: Whole body anterior and posterior images were obtained approximately 3 hours after intravenous injection of radiopharmaceutical. RADIOPHARMACEUTICALS:  20.2 mCi Technetium-927mDP IV COMPARISON:  CT same day, bone scan 04/30/2021 FINDINGS: No abnormal radiotracer accumulation within the axillary or appendicular skeleton to suggest skeletal metastasis. No change from comparison exam. Degenerative uptake noted in the L5-S1 vertebral bodies on the RIGHT associated with osteophytosis  on CT IMPRESSION: No scintigraphic evidence skeletal metastasis. No change from prior. Electronically Signed   By: Suzy Bouchard M.D.   On: 01/15/2022 10:54   DG Bone Density  Result Date: 12/29/2021 EXAM: DUAL X-RAY ABSORPTIOMETRY (DXA) FOR BONE MINERAL DENSITY IMPRESSION: Your patient Dung Salinger completed a BMD test on 12/29/2021 using the [place] (software version: 14.10) manufactured by [employer]. The following summarizes the results of our evaluation. Technologist: SCE PATIENT BIOGRAPHICAL: Name: Bayden, Gil Patient ID: [id number] Birth Date: 27-May-1935 Height: 65.0 in. Gender: Male Exam Date: 12/29/2021 Weight: 166.5 lbs. Indications: Advanced Age, Caucasian, Height Loss, Metastatic, Prostate Cancer Fractures: Femur Left Treatments: Prednisone, Vitamin D, zytiga DENSITOMETRY RESULTS: Site      Region    Measured Date Measured Age WHO Classification Young Adult T-score BMD         %Change vs. Previous Significant Change (*) AP Spine L1-L3 14/29/[age].9 Normal -1.0 1.098 g/cm2 - - DualFemur Neck Left 14/29/[age] 83.9 Osteoporosis -2.9 0.698 g/cm2 - - ASSESSMENT: The BMD measured at Femur Neck Left is 0.698 g/cm2 with a T-score of -2.9. This patient is considered osteoporotic according to the [facility]) criteria. The scan  quality is good. L4 was excluded due to degenerative changes. World Pharmacologist [facility]) criteria for post-menopausal, Caucasian Women: Normal:                   T-score at or above -1 SD Osteopenia/low bone mass: T-score between -1 and -2.5 SD Osteoporosis:             T-score at or below -2.5 SD RECOMMENDATIONS: 1. All patients should optimize calcium and vitamin D intake. 2. Consider FDA-approved medical therapies in postmenopausal women and men aged 67 years and older, based on the following: a. A hip or vertebral(clinical or morphometric) fracture b. T-score < -2.5 at the femoral neck or spine after appropriate evaluation to exclude secondary causes c. Low bone mass (T-score between -1.0 and -2.5 at the femoral neck or spine) and a 10-year probability of a hip fracture > 3% or a 10-year probability of a major osteoporosis-related fracture > 20% based on the [place]-adapted WHO algorithm 3. Clinician judgment and/or patient preferences may indicate treatment for people with 10-year fracture probabilities above or below these levels FOLLOW-UP: People with diagnosed cases of osteoporosis or osteopenia should be regularly tested for bone mineral density. For patients eligible for Medicare, routine testing is allowed once every 2 years. The testing frequency can be increased to one year for patients who have rapidly progressing disease, or for those who are receiving medical therapy to restore bone mass. I have reviewed this report, and agree with the above findings. [facility] Electronically Signed   By: Zerita Boers M.D.   On: 12/29/2021 13:20     Assessment and plan- Patient is a 86 y.o. 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  Overall he continues to show good response to treatment.  Stable areas of bone metastases and no evidence of  recurrent or progressive disease.  He will remain on ADT plus Zytiga.  He is tolerating Zytiga well without any significant side effects.  I will see him back in 3 months for next dose of Lupron.  Discussed the results of bone density scan which does show evidence of osteoporosis  at the left femur and neck.  He also had a pathologic fracture in the past secondary to prostate cancer s/p radiation.  I do think he would benefit from bisphosphonates for his osteoporosis and I would recommend doing Prolia or Reclast every 6 months.  Again discussed risks and benefits of bisphosphonates including all but not limited to osteonecrosis of the jaw.  Patient does not wish to see a dentist and also does not wish to get parenteral bisphosphonates due to the risk of osteonecrosis of the jaw.  However he is willing to try oral Fosamax on a weekly basis which we will send to his pharmacy.  Discussed risks and benefits of Fosamax including all but not limited to possible reflux disease.  Patient understands and agrees to proceed as planned.   Visit Diagnosis 1. Metastatic castration-sensitive adenocarcinoma of prostate ([place])   2. High risk medication use   3. Age-related osteoporosis with current pathological fracture of left femur with routine healing, subsequent encounter      Dr. Randa Evens, MD, MPH [facility] [phone] 01/20/2022 5:14 PM

## 2022-01-20 NOTE — Progress Notes (Signed)
Wife states she has noticed that times she notices pt feeling a little depressed.

## 2022-02-02 ENCOUNTER — Other Ambulatory Visit (HOSPITAL_COMMUNITY): Payer: Self-pay

## 2022-02-04 ENCOUNTER — Other Ambulatory Visit (HOSPITAL_COMMUNITY): Payer: Self-pay

## 2022-02-11 ENCOUNTER — Other Ambulatory Visit: Payer: Self-pay

## 2022-02-11 ENCOUNTER — Emergency Department
Admission: EM | Admit: 2022-02-11 | Discharge: 2022-02-11 | Disposition: A | Discharge status: Home / Self Care | Payer: Medicare Other | Attending: Emergency Medicine | Admitting: Emergency Medicine

## 2022-02-11 ENCOUNTER — Emergency Department: Payer: Medicare Other

## 2022-02-11 DIAGNOSIS — S01111A Laceration without foreign body of right eyelid and periocular area, initial encounter: Secondary | ICD-10-CM | POA: Diagnosis not present

## 2022-02-11 DIAGNOSIS — S0591XA Unspecified injury of right eye and orbit, initial encounter: Secondary | ICD-10-CM | POA: Diagnosis present

## 2022-02-11 DIAGNOSIS — Z8546 Personal history of malignant neoplasm of prostate: Secondary | ICD-10-CM | POA: Insufficient documentation

## 2022-02-11 DIAGNOSIS — S0181XA Laceration without foreign body of other part of head, initial encounter: Secondary | ICD-10-CM | POA: Diagnosis not present

## 2022-02-11 DIAGNOSIS — Z8616 Personal history of COVID-19: Secondary | ICD-10-CM | POA: Insufficient documentation

## 2022-02-11 DIAGNOSIS — W19XXXA Unspecified fall, initial encounter: Secondary | ICD-10-CM

## 2022-02-11 DIAGNOSIS — W01198A Fall on same level from slipping, tripping and stumbling with subsequent striking against other object, initial encounter: Secondary | ICD-10-CM | POA: Diagnosis not present

## 2022-02-11 DIAGNOSIS — S93601A Unspecified sprain of right foot, initial encounter: Secondary | ICD-10-CM | POA: Insufficient documentation

## 2022-02-11 DIAGNOSIS — F0390 Unspecified dementia without behavioral disturbance: Secondary | ICD-10-CM | POA: Insufficient documentation

## 2022-02-11 DIAGNOSIS — I10 Essential (primary) hypertension: Secondary | ICD-10-CM | POA: Diagnosis not present

## 2022-02-11 DIAGNOSIS — S0990XA Unspecified injury of head, initial encounter: Secondary | ICD-10-CM

## 2022-02-11 MED ORDER — LIDOCAINE-EPINEPHRINE 2 %-1:100000 IJ SOLN
20.0000 mL | Freq: Once | INTRAMUSCULAR | Status: AC
  Administered 2022-02-11: 20 mL
  Filled 2022-02-11: qty 1

## 2022-02-11 NOTE — ED Notes (Signed)
Wet to dry gauze/wrap placed over laceration to lateral right eye.

## 2022-02-11 NOTE — ED Provider Notes (Signed)
[facility] Provider Note    Event Date/Time   First MD Initiated Contact with Patient 02/11/22 1220     (approximate)   History   Fall   HPI  Alexis Cruz is a 86 y.o. male with a past medical history of dementia, hyperlipidemia, hypertension, prostate cancer with mets to the bone who presents today for evaluation after a trip and fall.  Patient reports that he tripped on a speed bump while carrying a large bag of trash.  He reports that he injured his right foot in the process, and fell forward and struck his head.  He reports that he did not lose consciousness.  He was able to get himself up and has been ambulatory since the event.  He sustained a laceration to the side of his head which has been bleeding ever since.  He reports that his last tetanus shot was in June of this year.  No vomiting.  No unilateral weakness or paresthesias.  No neck pain.  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   Anxiety 11/16/2019   ED (erectile dysfunction) 11/16/2019   Polymyalgia rheumatica ([place]) 01/24/2018   Dupuytren's contracture of hand 08/16/2017   OSA (obstructive sleep apnea) 07/23/2016   Immunization refused 03/08/2016   Dyslipidemia 03/27/2014   Encounter for long-term (current) use of medications 03/27/2014   Benign essential hypertension 08/21/2013   Allergic rhinitis 02/12/2013   Impaired fasting glucose 02/12/2013   Tobacco use disorder 09/28/2012   AK (actinic keratosis) 12/24/2011   Nummular eczema 12/24/2011   Carotid bruit 09/06/2011           Physical Exam   Triage Vital Signs: ED Triage Vitals  Enc Vitals Group     BP 02/11/22 1202 (!) 198/80     Pulse Rate 02/11/22 1159 85     Resp 02/11/22 1159 19     Temp 02/11/22 1159 97.7 F (36.5 C)     Temp Source 02/11/22 1159 Oral     SpO2 02/11/22 1159 95 %     Weight 02/11/22 1201 165 lb (74.8 kg)     Height 02/11/22 1201 '5\' 5"'$  (1.651 m)     Head Circumference --      Peak Flow --      Pain Score 02/11/22 1200 5     Pain Loc --      Pain Edu? --      Excl. in [place]? --     Most recent vital signs: Vitals:   02/11/22 1202 02/11/22 1340  BP: (!) 198/80 (!) 140/80  Pulse:  82  Resp:  18  Temp:  97.7 F (36.5 C)  SpO2:  96%    Physical Exam Vitals and nursing note reviewed.  Constitutional:      General: Awake and alert. No acute distress.    Appearance: Normal appearance. The patient is normal weight.  HENT:     Head: Normocephalic.  Superficial abrasions noted to forehead, as well as a 3 cm linear laceration to the lateral aspect of the right eye.  No periorbital tenderness, swelling, or ecchymosis.  Full and normal extraocular  movements.  No midface tenderness.  No trismus.  No mandibular tenderness.    Mouth: Mucous membranes are moist.  Eyes:     General: PERRL. Normal EOMs        Right eye: No discharge.        Left eye: No discharge.     Conjunctiva/sclera: Conjunctivae normal.  Cardiovascular:     Rate and Rhythm: Normal rate and regular rhythm.     Pulses: Normal pulses.     Heart sounds: Normal heart sounds Pulmonary:     Effort: Pulmonary effort is normal. No respiratory distress.     Breath sounds: Normal breath sounds.  Abdominal:     Abdomen is soft. There is no abdominal tenderness. No rebound or guarding. No distention. Musculoskeletal:        General: No swelling. Normal range of motion.     Cervical back: Normal range of motion and neck supple.  No midline cervical spine tenderness.  Full range of motion of neck.  Negative  Spurling test.  Negative Lhermitte sign.  Normal strength and sensation in bilateral upper extremities. Normal grip strength bilaterally.  Normal intrinsic muscle function of the hand bilaterally.  Normal radial pulses bilaterally. Right foot: No obvious deformity.  No ecchymosis or erythema.  No lateral or medial malleolar tenderness or proximal fifth metacarpal tenderness. No proximal fibular tenderness. 2+ pedal pulses with brisk capillary refill. Intact distal sensation and strength with normal ROM. Able to plantar flex and dorsiflex against resistance. Able to invert and evert against resistance. Negative dorsiflexion external rotation test. Negative squeeze test. Negative Thompson test.  Mild tenderness to palpation to the dorsum of the foot.  Foot is warm well-perfused. Skin:    General: Skin is warm and dry.     Capillary Refill: Capillary refill takes less than 2 seconds.     Findings: Superficial abrasions noted to left third and fourth fingers, dorsal aspect.  Patient is able to flex and extend fully and easily against resistance at isolated PIP and DIP.  No bony tenderness. Neurological:     Mental Status: The patient is awake and alert.  Neurological: GCS 15 alert and oriented x3 Normal speech, no expressive or receptive aphasia or dysarthria Cranial nerves II through XII intact Normal visual fields 5 out of 5 strength in all 4 extremities with intact sensation throughout No extremity drift Normal finger-to-nose testing, no limb or truncal ataxia     ED Results / Procedures / Treatments   Labs (all labs ordered are listed, but only abnormal results are displayed) Labs Reviewed - No data to display   EKG     RADIOLOGY I independently reviewed and interpreted imaging and agree with radiologists findings.     PROCEDURES:  Critical Care performed:   Marland KitchenMarland KitchenLaceration Repair  Date/Time: 02/11/2022 1:33 PM  Performed by: Marquette Old, PA-C Authorized by: Marquette Old, PA-C   Consent:    Consent obtained:  Verbal   Consent given by:  Patient   Risks, benefits, and alternatives were discussed: yes     Risks discussed:  Infection, need for additional repair, nerve damage, pain, poor cosmetic result, poor wound healing, retained foreign body, tendon damage and vascular damage   Alternatives discussed:  No treatment Universal protocol:    Procedure explained and questions answered to patient or proxy's satisfaction: yes     Relevant documents present and verified: yes     Test results available: yes     Imaging studies available:  yes     Required blood products, implants, devices, and special equipment available: yes     Site/side marked: yes     Immediately prior to procedure, a time out was called: yes     Patient identity confirmed:  Verbally with patient Anesthesia:    Anesthesia method:  Local infiltration   Local anesthetic:  Lidocaine 2% WITH epi Laceration details:    Location:  Face   Face location:  R eyebrow   Length (cm):  3   Depth (mm):  3 Pre-procedure details:    Preparation:  Patient was prepped and draped in usual sterile fashion Exploration:    Limited defect created (wound extended): no     Hemostasis achieved with:  Direct pressure   Imaging outcome: foreign body not noted     Wound exploration: wound explored through full range of motion     Wound extent: areolar tissue not violated, fascia not violated, no foreign body, no nerve damage, no tendon damage, no underlying fracture and no vascular damage     Contaminated: no   Treatment:    Area cleansed with:  Saline   Amount of cleaning:  Standard   Irrigation solution:  Sterile saline   Irrigation volume:  500cc   Irrigation method:  Syringe   Visualized foreign bodies/material removed: no     Debridement:  None   Undermining:  None   Scar revision: no   Skin repair:    Repair method:  Sutures   Suture size:  5-0   Suture material:  Nylon   Suture technique:   Simple interrupted   Number of sutures:  4 Repair type:    Repair type:  Simple Post-procedure details:    Dressing:  Open (no dressing)   Procedure completion:  Tolerated well, no immediate complications    MEDICATIONS ORDERED IN ED: Medications  lidocaine-EPINEPHrine (XYLOCAINE W/EPI) 2 %-1:100000 (with pres) injection 20 mL (20 mLs Infiltration Given by Other 02/11/22 1324)     IMPRESSION / MDM / [place] / ED COURSE  I reviewed the triage vital signs and the nursing notes.   Differential diagnosis includes, but is not limited to, contusion, concussion, intracranial hemorrhage, skull fracture, cervical spine injury, facial fracture, laceration.  Patient is awake and alert, hemodynamically stable and afebrile.  He has full and normal extraocular movements, no evidence of entrapment.  No septal hematoma.  He has no focal neurological deficits.  CT head and neck were obtained per [place] criteria, as well as CT face given his significant laceration.  He also complained of right foot pain after the fall, though there is no obvious deformity.  X-ray was obtained and is negative for any acute fracture or bony injury.  He has superficial abrasions to his left hand, though full and normal range of motion of all of his digits, do not suspect tendon rupture.  He does not have any bony tenderness on exam.  He was offered x-ray of his hand but he declined.  CT scans are also reassuring for any acute findings.  Patient's wound was anesthetized and irrigated.  It was closed with sutures.  Discussed suture care and timeline for removal.  Tdap was not provided as patient reports that he got it 5 months ago.  We discussed strict return precautions and the importance of close outpatient follow-up.  Patient understands and agrees with plan.  Discharged in stable condition.   Patient's presentation is most consistent with acute complicated illness / injury  requiring diagnostic  workup.     FINAL CLINICAL IMPRESSION(S) / ED DIAGNOSES   Final diagnoses:  Fall, initial encounter  Injury of head, initial encounter  Facial laceration, initial encounter  Foot sprain, right, initial encounter     Rx / DC Orders   ED Discharge Orders     None        Note:  This document was prepared using Dragon voice recognition software and may include unintentional dictation errors.   Emeline Gins 02/11/22 1348    Vanessa [place], MD 02/11/22 1400

## 2022-02-11 NOTE — ED Triage Notes (Addendum)
Pt states tripping over a speed bump when taking out his trash. Pt report hitting his head on the concrete. Pt denies loss of consciousness and denies being on blood thinners. Laceration to right side of eye noted, pt holding pressure with towel. Pt states up to date on tdap

## 2022-02-11 NOTE — Discharge Instructions (Addendum)
Your CT scans and xrays were normal. You were evaluated in the emergency department for a laceration. It was repaired with sutures. Keep the area clean and dry.  Wash multiple times per day with soap and water.  Do not go into the ocean or swimming pool.  Return to the emergency department or your primary care physician's office in 7 days for suture removal.  Return to the emergency department for:  -- Fever > 100.64F -- Increase pain in the wound -- Increase redness and swelling -- Pus coming from the wound -- Wound bleeds more than a small amount or it does not stop -- Wound edges come apart -- Severe pain -- Weakness or numbness in the affected area  Or any other new or worsening symptoms. It was a pleasure caring for you.

## 2022-03-02 ENCOUNTER — Other Ambulatory Visit (HOSPITAL_COMMUNITY): Payer: Self-pay

## 2022-03-02 ENCOUNTER — Other Ambulatory Visit: Payer: Self-pay | Admitting: Medical Oncology

## 2022-03-02 DIAGNOSIS — C61 Malignant neoplasm of prostate: Secondary | ICD-10-CM

## 2022-03-04 ENCOUNTER — Other Ambulatory Visit: Payer: Self-pay | Admitting: Medical Oncology

## 2022-03-04 ENCOUNTER — Other Ambulatory Visit (HOSPITAL_COMMUNITY): Payer: Self-pay

## 2022-03-04 DIAGNOSIS — Z191 Hormone sensitive malignancy status: Secondary | ICD-10-CM

## 2022-03-05 ENCOUNTER — Encounter: Payer: Self-pay | Admitting: Oncology

## 2022-03-05 ENCOUNTER — Other Ambulatory Visit: Payer: Self-pay

## 2022-03-05 ENCOUNTER — Other Ambulatory Visit (HOSPITAL_COMMUNITY): Payer: Self-pay

## 2022-03-05 MED ORDER — ABIRATERONE ACETATE 250 MG PO TABS
ORAL_TABLET | Freq: Every day | ORAL | 3 refills | Status: DC
  Filled 2022-03-05: qty 120, 30d supply, fill #0
  Filled 2022-04-05: qty 120, 30d supply, fill #1
  Filled 2022-05-11: qty 120, 30d supply, fill #2
  Filled 2022-06-04: qty 120, 30d supply, fill #3

## 2022-03-10 ENCOUNTER — Other Ambulatory Visit: Payer: Self-pay

## 2022-03-25 ENCOUNTER — Other Ambulatory Visit: Payer: Self-pay | Admitting: Oncology

## 2022-03-25 DIAGNOSIS — C61 Malignant neoplasm of prostate: Secondary | ICD-10-CM

## 2022-03-31 ENCOUNTER — Other Ambulatory Visit (HOSPITAL_COMMUNITY): Payer: Self-pay

## 2022-04-05 ENCOUNTER — Other Ambulatory Visit: Payer: Self-pay

## 2022-04-27 ENCOUNTER — Other Ambulatory Visit (HOSPITAL_COMMUNITY): Payer: Self-pay

## 2022-04-30 ENCOUNTER — Encounter: Payer: Self-pay | Admitting: Oncology

## 2022-05-07 ENCOUNTER — Encounter: Payer: Self-pay | Admitting: Oncology

## 2022-05-07 ENCOUNTER — Inpatient Hospital Stay: Payer: Medicare Other

## 2022-05-07 ENCOUNTER — Inpatient Hospital Stay: Payer: Medicare Other | Attending: Oncology

## 2022-05-07 ENCOUNTER — Inpatient Hospital Stay (HOSPITAL_BASED_OUTPATIENT_CLINIC_OR_DEPARTMENT_OTHER): Payer: Medicare Other | Admitting: Oncology

## 2022-05-07 VITALS — BP 221/96 | HR 72 | Temp 98.7°F | Wt 172.6 lb

## 2022-05-07 DIAGNOSIS — Z5181 Encounter for therapeutic drug level monitoring: Secondary | ICD-10-CM

## 2022-05-07 DIAGNOSIS — C61 Malignant neoplasm of prostate: Secondary | ICD-10-CM | POA: Diagnosis present

## 2022-05-07 DIAGNOSIS — F17210 Nicotine dependence, cigarettes, uncomplicated: Secondary | ICD-10-CM | POA: Diagnosis not present

## 2022-05-07 DIAGNOSIS — C7951 Secondary malignant neoplasm of bone: Secondary | ICD-10-CM | POA: Diagnosis not present

## 2022-05-07 DIAGNOSIS — Z5111 Encounter for antineoplastic chemotherapy: Secondary | ICD-10-CM | POA: Diagnosis not present

## 2022-05-07 DIAGNOSIS — Z79899 Other long term (current) drug therapy: Secondary | ICD-10-CM

## 2022-05-07 DIAGNOSIS — I10 Essential (primary) hypertension: Secondary | ICD-10-CM | POA: Diagnosis not present

## 2022-05-07 DIAGNOSIS — Z191 Hormone sensitive malignancy status: Secondary | ICD-10-CM

## 2022-05-07 DIAGNOSIS — Z79818 Long term (current) use of other agents affecting estrogen receptors and estrogen levels: Secondary | ICD-10-CM

## 2022-05-07 LAB — CBC WITH DIFFERENTIAL/PLATELET
Abs Immature Granulocytes: 0.06 10*3/uL (ref 0.00–0.07)
Basophils Absolute: 0 10*3/uL (ref 0.0–0.1)
Basophils Relative: 0 %
Eosinophils Absolute: 0.1 10*3/uL (ref 0.0–0.5)
Eosinophils Relative: 1 %
HCT: 39.2 % (ref 39.0–52.0)
Hemoglobin: 12.3 g/dL — ABNORMAL LOW (ref 13.0–17.0)
Immature Granulocytes: 1 %
Lymphocytes Relative: 12 %
Lymphs Abs: 1 10*3/uL (ref 0.7–4.0)
MCH: 28.7 pg (ref 26.0–34.0)
MCHC: 31.4 g/dL (ref 30.0–36.0)
MCV: 91.6 fL (ref 80.0–100.0)
Monocytes Absolute: 0.9 10*3/uL (ref 0.1–1.0)
Monocytes Relative: 11 %
Neutro Abs: 5.7 10*3/uL (ref 1.7–7.7)
Neutrophils Relative %: 75 %
Platelets: 170 10*3/uL (ref 150–400)
RBC: 4.28 MIL/uL (ref 4.22–5.81)
RDW: 14.9 % (ref 11.5–15.5)
WBC: 7.7 10*3/uL (ref 4.0–10.5)
nRBC: 0 % (ref 0.0–0.2)

## 2022-05-07 LAB — COMPREHENSIVE METABOLIC PANEL
ALT: 15 U/L (ref 0–44)
AST: 22 U/L (ref 15–41)
Albumin: 4.4 g/dL (ref 3.5–5.0)
Alkaline Phosphatase: 70 U/L (ref 38–126)
Anion gap: 8 (ref 5–15)
BUN: 21 mg/dL (ref 8–23)
CO2: 23 mmol/L (ref 22–32)
Calcium: 9.1 mg/dL (ref 8.9–10.3)
Chloride: 102 mmol/L (ref 98–111)
Creatinine, Ser: 1.08 mg/dL (ref 0.61–1.24)
GFR, Estimated: >60 mL/min (ref >60)
Glucose, Bld: 110 mg/dL — ABNORMAL HIGH (ref 70–99)
Potassium: 4.1 mmol/L (ref 3.5–5.1)
Sodium: 133 mmol/L — ABNORMAL LOW (ref 135–145)
Total Bilirubin: 0.8 mg/dL (ref 0.3–1.2)
Total Protein: 7.4 g/dL (ref 6.5–8.1)

## 2022-05-07 LAB — PSA: Prostatic Specific Antigen: <0.01 ng/mL (ref 0.00–4.00)

## 2022-05-07 MED ORDER — LEUPROLIDE ACETATE (6 MONTH) 45 MG ~~LOC~~ KIT
45.0000 mg | PACK | Freq: Once | SUBCUTANEOUS | Status: AC
  Administered 2022-05-07: 45 mg via SUBCUTANEOUS
  Filled 2022-05-07: qty 45

## 2022-05-07 NOTE — Progress Notes (Signed)
BP 221/96 ok to give eligard per MD

## 2022-05-07 NOTE — Progress Notes (Signed)
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 Posey Pronto for symptoms of left hip pain which prompted x-rays followed by MRI of the left hip without contrast.  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  Patient was subsequently seen by orthopedics Dr. Pathology who did not recommend any orthopedic intervention for the left hip fracture he has been referred to oncology for further management patient's last PSA was checked in 2014 which was elevated at 7.4.  Patient is also a chronic smoker and smokes about 1 pack of cigarettes per day since 1968.   Head CT scan showed widespread bony metastatic disease with pathologic fracture of the left acetabulum.  Pathologic fracture of the T3 vertebral body with 50% loss of height possible fracture of the left seventh rib.  No evidence of visceral metastases.  PSA elevated at 35.  Bone biopsy consistent with prostate adenocarcinoma.  Unable to obtain NGS testing on bone sample   Patient received Mills Koller and is currently on Zytiga.  Zytiga started in November 2021   Patient also seen at [facility] for second opinion related with [facility] and [facility].  They recommended bisphosphonates given pathologic fracture of the acetabulum.  However patient did not wish to take  it given the risk of osteonecrosis of the jaw      Interval history-tolerating Zytiga well without any significant side effects.  He had a fall back in November 2023 but no fractures.  He is presently on lisinopril 40 mg daily and his blood pressure has not been well-controlled.  Most of his blood pressure readings have been high more than systolic [id number].  Denies any headaches chest pain or syncopal episodes.  ECOG PS- 1 Pain scale- 0 Opioid associated constipation- no  Review of systems- Review of Systems  Constitutional:  Positive for malaise/fatigue. Negative for chills, fever and weight loss.  HENT:  Negative for congestion, ear discharge and nosebleeds.   Eyes:  Negative for blurred vision.  Respiratory:  Negative for cough, hemoptysis, sputum production, shortness of breath and wheezing.   Cardiovascular:  Negative for chest pain, palpitations, orthopnea and claudication.  Gastrointestinal:  Negative for abdominal pain, blood in stool, constipation, diarrhea, heartburn, melena, nausea and vomiting.  Genitourinary:  Negative for dysuria, flank pain, frequency, hematuria and urgency.  Musculoskeletal:  Negative for back pain, joint pain and myalgias.  Skin:  Negative for rash.  Neurological:  Negative for dizziness, tingling, focal weakness, seizures, weakness and headaches.  Endo/Heme/Allergies:  Does not bruise/bleed easily.  Psychiatric/Behavioral:  Negative for depression and suicidal ideas. The patient does not have insomnia.       Allergies  Allergen Reactions   Citalopram Other (See Comments)    Unsure but family remembers that he could not take the dru-not sure what happened     Past Medical History:  Diagnosis Date   Hypertension    Prostate cancer ([place])  History reviewed. No pertinent surgical history.  Social History   Socioeconomic History   Marital status: Married    Spouse name: Not on file   Number of children: Not on file   Years of education: Not  on file   Highest education level: Not on file  Occupational History   Not on file  Tobacco Use   Smoking status: Former    Types: Pipe   Smokeless tobacco: Former   Tobacco comments:    stopped pipes 3 month ago  Vaping Use   Vaping Use: Never used  Substance and Sexual Activity   Alcohol use: Not Currently   Drug use: Never   Sexual activity: Not on file  Other Topics Concern   Not on file  Social History Narrative   Not on file   Social Determinants of Health   Financial Resource Strain: Not on file  Food Insecurity: Not on file  Transportation Needs: Not on file  Physical Activity: Not on file  Stress: Not on file  Social Connections: Not on file  Intimate Partner Violence: Not on file    History reviewed. No pertinent family history.   Current Outpatient Medications:    abiraterone acetate (ZYTIGA) 250 MG tablet, TAKE 4 TABLETS (1,000 MG TOTAL) BY MOUTH DAILY. TAKE ON AN EMPTY STOMACH 1 HOUR BEFORE OR 2 HOURS AFTER A MEAL, Disp: 120 tablet, Rfl: 3   Cholecalciferol 125 MCG (5000 UT) capsule, Take 5,000 Units by mouth once a week., Disp: , Rfl:    cyanocobalamin 1000 MCG tablet, Take by mouth., Disp: , Rfl:    donepezil (ARICEPT) 5 MG tablet, Take by mouth., Disp: , Rfl:    lisinopril (ZESTRIL) 20 MG tablet, Take 20 mg by mouth daily., Disp: , Rfl:    predniSONE (DELTASONE) 5 MG tablet, Take 1 tablet by mouth once daily with breakfast, Disp: 90 tablet, Rfl: 0   zinc gluconate 50 MG tablet, Take 50 mg by mouth daily., Disp: , Rfl:    alendronate (FOSAMAX) 70 MG tablet, Take 1 tablet (70 mg total) by mouth once a week. Take with a full glass of water on an empty stomach. (Patient not taking: Reported on 05/07/2022), Disp: 12 tablet, Rfl: 0   Docusate Sodium (DSS) 100 MG CAPS, Take by mouth. (Patient not taking: Reported on 10/20/2021), Disp: , Rfl:    Omega-3 Fatty Acids (FISH OIL) 1200 MG CAPS, Take 1,200 mg by mouth daily. (Patient not taking: Reported on 01/20/2022),  Disp: , Rfl:    senna (SENOKOT) 8.6 MG tablet, Take by mouth. (Patient not taking: Reported on 10/20/2021), Disp: , Rfl:  No current facility-administered medications for this visit.  Facility-Administered Medications Ordered in Other Visits:    Leuprolide Acetate (3 Month) (ELIGARD) 22.5 MG injection 22.5 mg, 22.5 mg, Subcutaneous, Q90 days, Sindy Guadeloupe, MD, 22.5 mg at 05/06/20 1116   Leuprolide Acetate (3 Month) (ELIGARD) 22.5 MG injection 22.5 mg, 22.5 mg, Subcutaneous, Q90 days, Sindy Guadeloupe, MD, 22.5 mg at 08/04/20 1044  Physical exam:  Vitals:   05/07/22 1108 05/07/22 1115  BP: (!) 204/90 (!) 221/96  Pulse: 72   Temp: 98.7 F (37.1 C)   SpO2: 100%   Weight: 172 lb 9.6 oz (78.3 kg)    Physical Exam Cardiovascular:     Rate and Rhythm: Normal rate and regular rhythm.     Heart sounds: Normal heart sounds.  Pulmonary:     Effort: Pulmonary effort is normal.     Breath  sounds: Normal breath sounds.  Skin:    General: Skin is warm and dry.  Neurological:     Mental Status: He is alert and oriented to person, place, and time.         Latest Ref Rng & Units 05/07/2022   10:51 AM  CMP  Glucose 70 - 99 mg/dL 110   BUN 8 - 23 mg/dL 21   Creatinine 0.61 - 1.24 mg/dL 1.08   Sodium 135 - 145 mmol/L 133   Potassium 3.5 - 5.1 mmol/L 4.1   Chloride 98 - 111 mmol/L 102   CO2 22 - 32 mmol/L 23   Calcium 8.9 - 10.3 mg/dL 9.1   Total Protein 6.5 - 8.1 g/dL 7.4   Total Bilirubin 0.3 - 1.2 mg/dL 0.8   Alkaline Phos 38 - 126 U/L 70   AST 15 - 41 U/L 22   ALT 0 - 44 U/L 15       Latest Ref Rng & Units 05/07/2022   10:51 AM  CBC  WBC 4.0 - 10.5 K/uL 7.7   Hemoglobin 13.0 - 17.0 g/dL 12.3   Hematocrit 39.0 - 52.0 % 39.2   Platelets 150 - 400 K/uL 170     No images are attached to the encounter.  No results found.   Assessment and plan- Patient is a 87 y.o. 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  PSA remains undetectable.  PSA from today is pending.  Scans in September 2023 showed stable areas of bone metastases.  He will receive Lupron today and I will see him back in 6 months for the next dose of Lupron.  Interim labs to be checked again in 3 months.  As long as PSA remains undetectable I would not be repeating systemic scans at this time.  Uncontrolled hypertension: Presently I am more concerned about his blood pressure readings which in my office today were 200.  He was prescribed as needed hydralazine which I do not feel would be a good long-term blood pressure medication option.  Patient needs a second blood pressure medication in addition to lisinopril 40 mg.  He was previously on hydrochlorothiazide but patient's wife states that it lowered his blood pressure too much.  I have asked her to get in touch with Dr. Raylene Miyamoto office and inform them about blood pressure readings to adjust his medications accordingly.  If he develops any new or worsening headaches chest pain or strokelike symptoms he needs to go to the ER   Visit Diagnosis 1. 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 High risk medication use   3. Benign essential hypertension   4. Encounter for monitoring Lupron therapy      Dr. Randa Evens, MD, MPH [facility] [id number] 05/07/2022 1:24 PM

## 2022-05-11 ENCOUNTER — Other Ambulatory Visit: Payer: Self-pay

## 2022-05-12 IMAGING — MR MR HEAD WO/W CM
14 series · 48 of 48 positions shown · IV contrast (gadavist)
Comparison: None.

CLINICAL DATA: Alzheimer's disease (HCC) G30.9, YXO.2X (SHY-A6-CM)

EXAM:
MRI HEAD WITHOUT AND WITH CONTRAST
TECHNIQUE: Multiplanar, multiecho pulse sequences of the brain and surrounding
structures were obtained without and with intravenous contrast.
CONTRAST:  7mL GADAVIST GADOBUTROL 1 MMOL/ML IV SOLN

[Series 5: ax dwi_tracew · axial · 3.0mm · 0.65mm/px · z∈[-122,+32]mm · 3 of 48 slices shown]
[im 1/48]
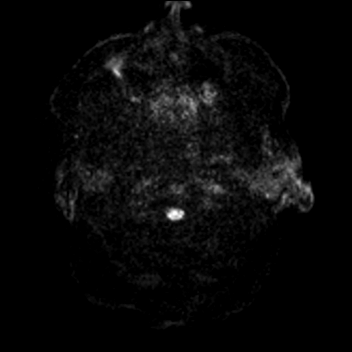
[im 24/48]
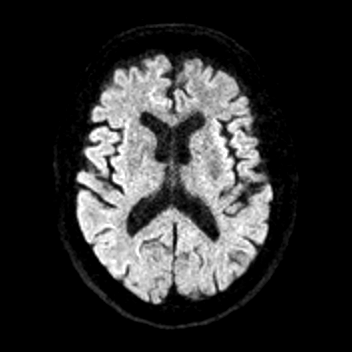
[im 48/48]
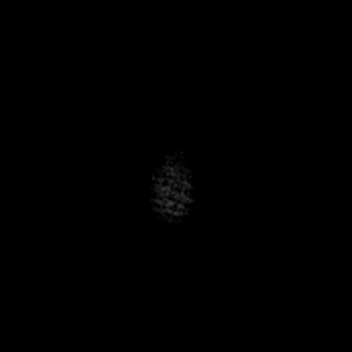

[Series 6: ax dwi_adc · axial · 3.0mm · 0.65mm/px · z∈[-122,+32]mm · 3 of 48 slices shown]
[im 1/48]
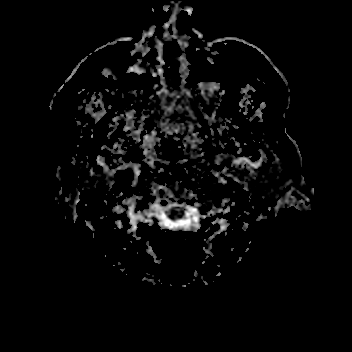
[im 24/48]
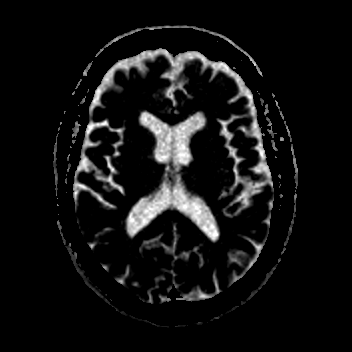
[im 48/48]
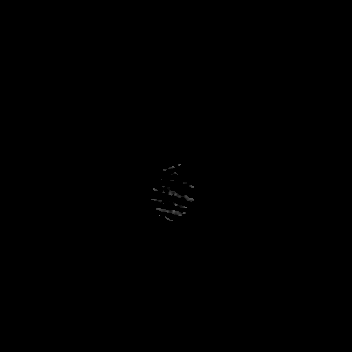

[Series 7: cor dwi_tracew · coronal · 5.0mm · 0.65mm/px · 4 of 80 slices shown]
[im 1/80]
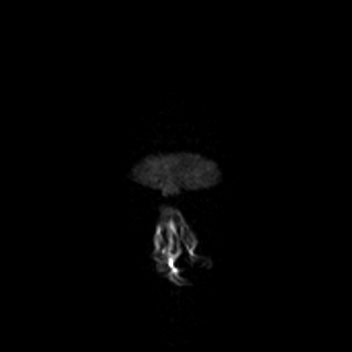
[im 27/80]
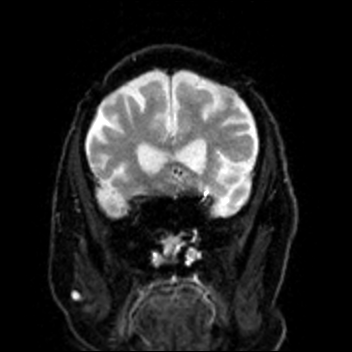
[im 53/80]
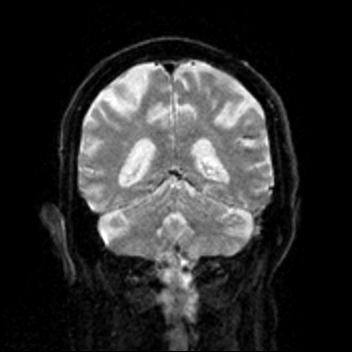
[im 80/80]
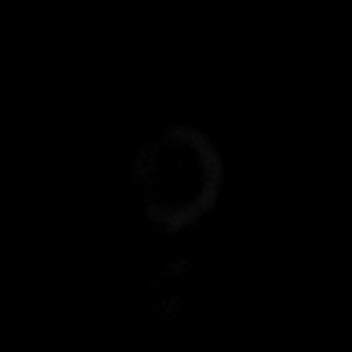

[Series 8: cor dwi_adc · coronal · 5.0mm · 0.65mm/px · 2 of 40 slices shown]
[im 1/40]
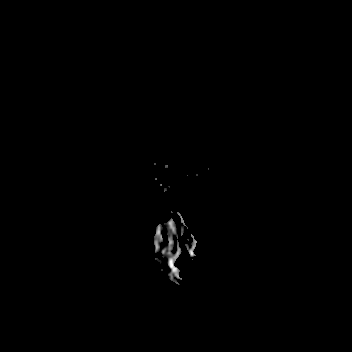
[im 40/40]
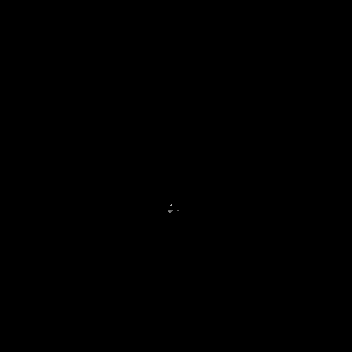

[Series 9: T1 · sagittal · 5.0mm · 0.62mm/px · 1 of 23 slices shown (1 of 2)]
[im 1/23]
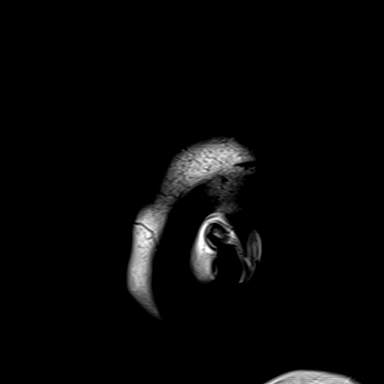

[Series 10: T2 · axial · 5.0mm · 0.53mm/px · 1 of 25 slices shown]
[im 1/25]
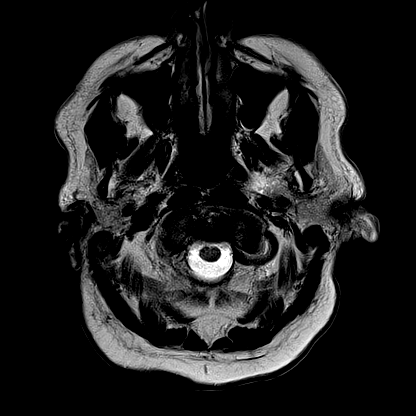

[Series 11: mag_images · axial · 3.0mm · 0.90mm/px · z∈[-132,+44]mm · 3 of 60 slices shown]
[im 1/60]
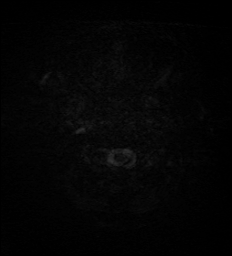
[im 30/60]
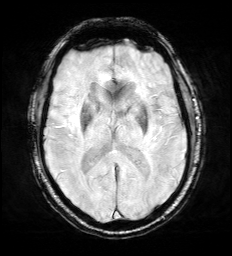
[im 60/60]
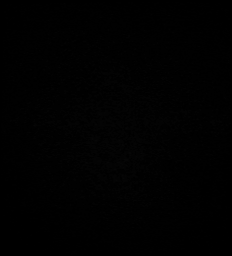

[Series 12: pha_images · axial · 3.0mm · 0.90mm/px · z∈[-132,+44]mm · 3 of 59 slices shown]
[im 1/59]
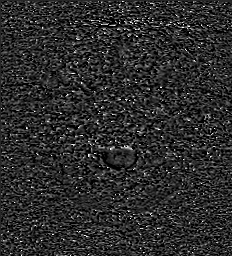
[im 30/59]
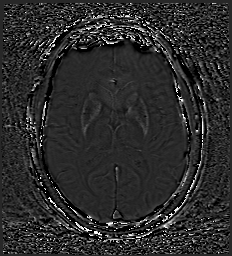
[im 59/59]
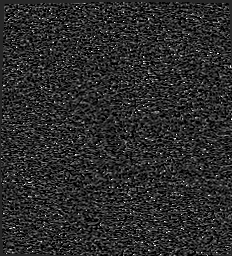

[Series 13: swi_images · axial · 3.0mm · 0.90mm/px · z∈[-132,+44]mm · 3 of 60 slices shown]
[im 1/60]
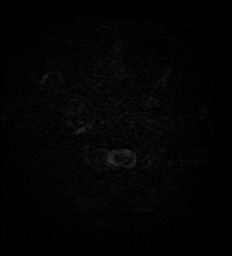
[im 30/60]
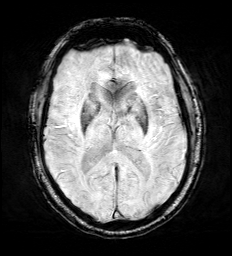
[im 60/60]
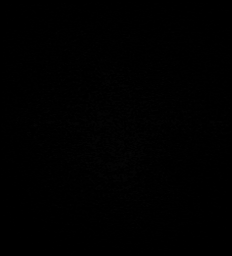

[Series 15: FLAIR · axial · 3.0mm · 0.53mm/px · z∈[-125,+36]mm · 3 of 55 slices shown]
[im 1/55]
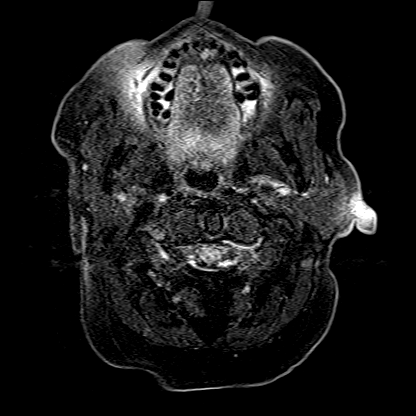
[im 28/55]
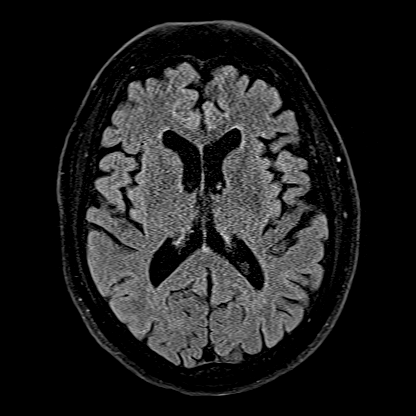
[im 55/55]
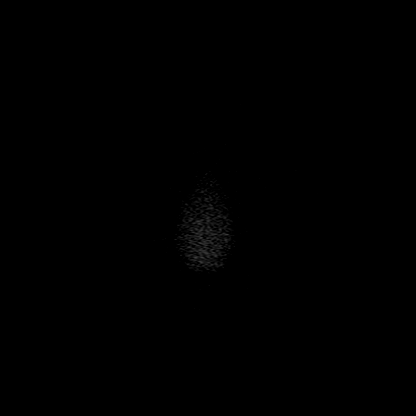

[Series 16: T1 · axial · 1.0mm · 0.98mm/px · z∈[-132,+42]mm · 9 of 176 slices shown (2 of 2)]
[im 1/176]
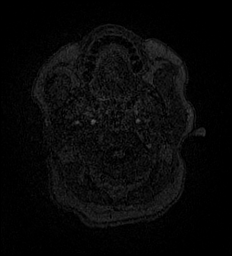
[im 22/176]
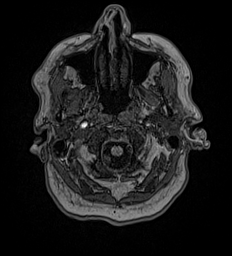
[im 44/176]
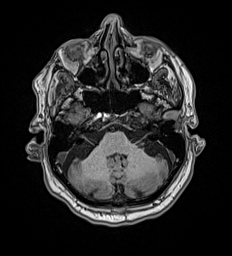
[im 66/176]
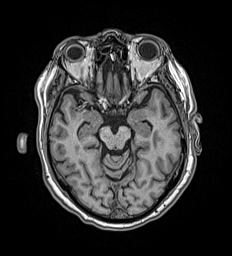
[im 88/176]
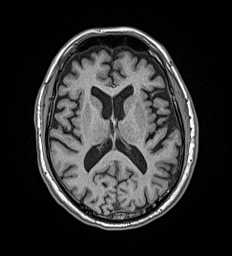
[im 110/176]
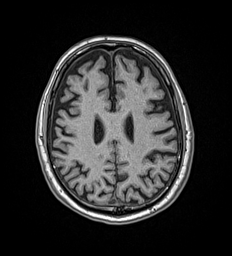
[im 132/176]
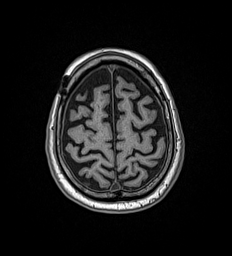
[im 154/176]
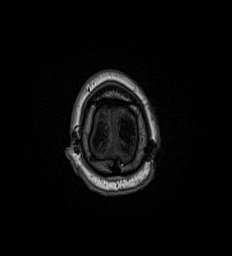
[im 176/176]
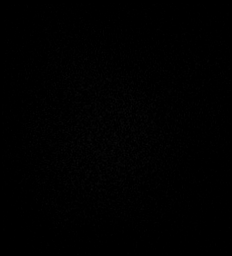

[Series 17: T2 post-contrast · coronal · 5.0mm · 0.57mm/px · 2 of 29 slices shown]
[im 1/29]
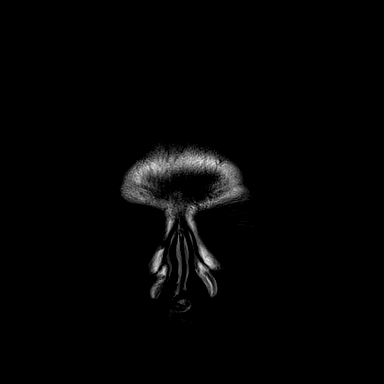
[im 29/29]
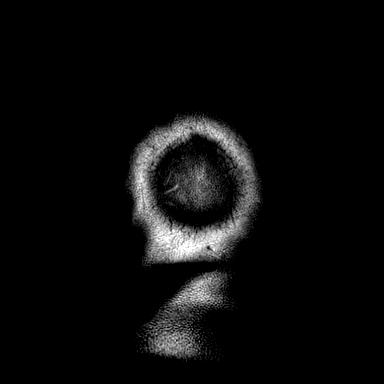

[Series 18: T1 post-contrast · axial · 1.0mm · 0.98mm/px · z∈[-132,+42]mm · 9 of 176 slices shown (1 of 2)]
[im 1/176]
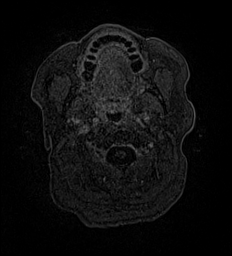
[im 22/176]
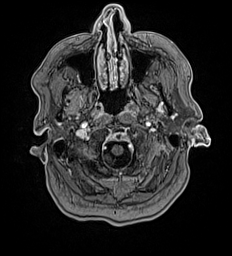
[im 44/176]
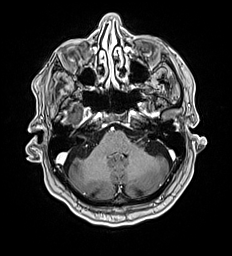
[im 66/176]
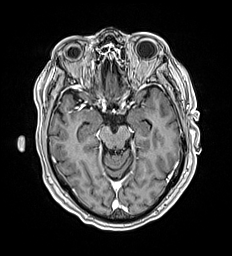
[im 88/176]
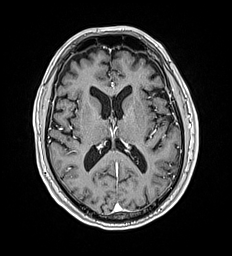
[im 110/176]
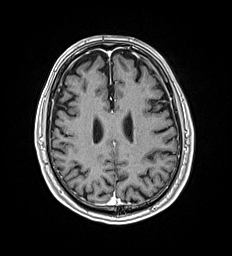
[im 132/176]
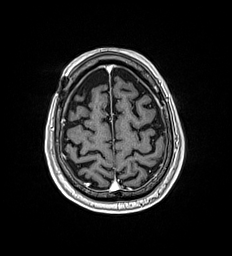
[im 154/176]
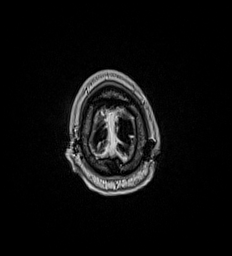
[im 176/176]
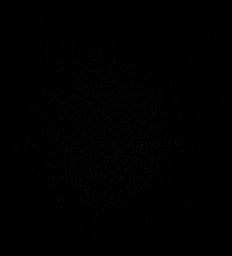

[Series 19: T1 post-contrast · coronal · 5.0mm · 0.57mm/px · 2 of 29 slices shown (2 of 2)]
[im 1/29]
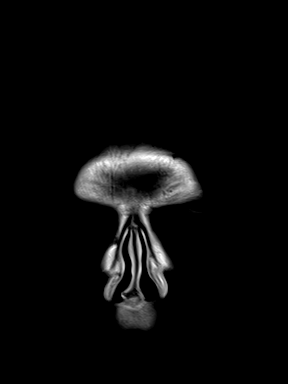
[im 29/29]
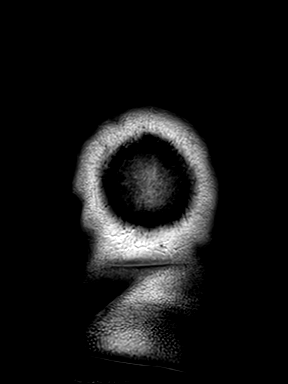

[48 of 48 positions shown; findings below may reference images not displayed]

FINDINGS: Brain: No acute infarction, hemorrhage, hydrocephalus, extra-axial
collection or mass lesion. Minimal chronic microvascular ischemic
disease. Mild for age atrophy. No abnormal enhancement.

Vascular: Major arterial flow voids are maintained at the skull
base.

Skull and upper cervical spine: Likely postsurgical changes of the
calvarium bilaterally. Otherwise, normal marrow signal.

Sinuses/Orbits: Mild paranasal sinus mucosal thickening.
Unremarkable orbits.

Other: No mastoid effusions.
IMPRESSION: 1. No evidence of acute intracranial abnormality.
2. Mild for age atrophy. Minimal chronic microvascular ischemic
disease.

## 2022-06-04 ENCOUNTER — Other Ambulatory Visit (HOSPITAL_COMMUNITY): Payer: Self-pay

## 2022-06-09 ENCOUNTER — Other Ambulatory Visit (HOSPITAL_COMMUNITY): Payer: Self-pay

## 2022-06-10 ENCOUNTER — Other Ambulatory Visit (HOSPITAL_COMMUNITY): Payer: Self-pay

## 2022-06-10 ENCOUNTER — Other Ambulatory Visit: Payer: Self-pay

## 2022-06-23 ENCOUNTER — Other Ambulatory Visit: Payer: Self-pay | Admitting: Oncology

## 2022-06-23 DIAGNOSIS — C61 Malignant neoplasm of prostate: Secondary | ICD-10-CM

## 2022-07-02 ENCOUNTER — Other Ambulatory Visit: Payer: Self-pay

## 2022-07-05 ENCOUNTER — Other Ambulatory Visit: Payer: Self-pay | Admitting: Medical Oncology

## 2022-07-05 ENCOUNTER — Other Ambulatory Visit: Payer: Self-pay

## 2022-07-05 DIAGNOSIS — C61 Malignant neoplasm of prostate: Secondary | ICD-10-CM

## 2022-07-05 MED ORDER — ABIRATERONE ACETATE 250 MG PO TABS
ORAL_TABLET | Freq: Every day | ORAL | 3 refills | Status: DC
Start: 2022-07-05 — End: 2022-10-28
  Filled 2022-07-05: qty 120, 30d supply, fill #0
  Filled 2022-08-12: qty 120, 30d supply, fill #1
  Filled 2022-09-03: qty 120, 30d supply, fill #2
  Filled 2022-10-05: qty 120, 30d supply, fill #3

## 2022-07-07 ENCOUNTER — Other Ambulatory Visit (HOSPITAL_COMMUNITY): Payer: Self-pay

## 2022-07-13 ENCOUNTER — Other Ambulatory Visit (HOSPITAL_COMMUNITY): Payer: Self-pay

## 2022-08-03 ENCOUNTER — Other Ambulatory Visit (HOSPITAL_COMMUNITY): Payer: Self-pay

## 2022-08-05 ENCOUNTER — Inpatient Hospital Stay: Payer: Medicare Other | Attending: Oncology

## 2022-08-12 ENCOUNTER — Other Ambulatory Visit (HOSPITAL_COMMUNITY): Payer: Self-pay

## 2022-08-12 ENCOUNTER — Other Ambulatory Visit: Payer: Self-pay

## 2022-08-19 ENCOUNTER — Encounter: Payer: Self-pay | Admitting: Oncology

## 2022-08-19 ENCOUNTER — Telehealth: Payer: Self-pay

## 2022-08-19 ENCOUNTER — Other Ambulatory Visit (HOSPITAL_COMMUNITY): Payer: Self-pay

## 2022-08-19 NOTE — Telephone Encounter (Signed)
Oral Oncology Patient Advocate Encounter  Was successful in securing patient a $8,000.00 grant from [facility] to provide copayment coverage for Abiraterone.  This will keep the out of pocket expense at $0.     [employer] ID: [id number]   The billing information is as follows and has been shared with [facility].    RxBin: [id number] PCN: [id number] Member ID: [id number] Group ID: [id number] Dates of Eligibility: 07/20/22 through 07/19/23  Fund:  Prostate Cancer - Medicare Access   Ardeen Fillers, CPhT Oncology Pharmacy Patient Advocate  [facility]  [phone] (phone) [phone] (fax) 08/19/2022 12:33 PM

## 2022-08-20 NOTE — Telephone Encounter (Signed)
I called and spoke with patient's spouse and let them know about the grant approval. Patient and spouse are aware co-pay for Abiraterone should be $0.00 until grant expiration or funds are exhausted. Patient and spouse also know to call me at [phone] with any questions or concerns regarding co-pay.    Ardeen Fillers, CPhT Oncology Pharmacy Patient Advocate  [facility]  [phone] (phone) [phone] (fax) 08/20/2022 9:51 AM

## 2022-09-03 ENCOUNTER — Other Ambulatory Visit (HOSPITAL_COMMUNITY): Payer: Self-pay

## 2022-09-08 ENCOUNTER — Other Ambulatory Visit: Payer: Self-pay

## 2022-09-08 ENCOUNTER — Other Ambulatory Visit (HOSPITAL_COMMUNITY): Payer: Self-pay

## 2022-09-08 ENCOUNTER — Telehealth: Payer: Self-pay

## 2022-09-08 NOTE — Telephone Encounter (Signed)
Oral Oncology Patient Advocate Encounter  Re-authorization   Received notification that prior authorization for Abiraterone is due for renewal.   PA submitted on 09/08/22  Key [id number]  Status is pending     Ardeen Fillers, CPhT Oncology Pharmacy Patient Advocate  [facility]  [phone] (phone) [phone] (fax) 09/08/2022 4:07 PM

## 2022-09-09 ENCOUNTER — Other Ambulatory Visit: Payer: Self-pay

## 2022-09-09 ENCOUNTER — Other Ambulatory Visit (HOSPITAL_COMMUNITY): Payer: Self-pay

## 2022-09-09 NOTE — Telephone Encounter (Signed)
Oral Oncology Patient Advocate Encounter  Prior Authorization for Abiraterone has been approved.    PA# [id number]  Effective dates: 09/08/22 through 03/07/23  Patient may continue to fill with [facility].    Ardeen Fillers, CPhT Oncology Pharmacy Patient Advocate  [facility]  [phone] (phone) [phone] (fax) 09/09/2022 7:54 AM

## 2022-09-20 ENCOUNTER — Other Ambulatory Visit: Payer: Self-pay | Admitting: Oncology

## 2022-09-20 DIAGNOSIS — C61 Malignant neoplasm of prostate: Secondary | ICD-10-CM

## 2022-10-05 ENCOUNTER — Other Ambulatory Visit (HOSPITAL_COMMUNITY): Payer: Self-pay

## 2022-10-06 ENCOUNTER — Other Ambulatory Visit (HOSPITAL_COMMUNITY): Payer: Self-pay

## 2022-10-28 ENCOUNTER — Other Ambulatory Visit: Payer: Self-pay | Admitting: Oncology

## 2022-10-28 ENCOUNTER — Other Ambulatory Visit (HOSPITAL_COMMUNITY): Payer: Self-pay

## 2022-10-28 ENCOUNTER — Other Ambulatory Visit: Payer: Self-pay | Admitting: *Deleted

## 2022-10-28 DIAGNOSIS — C61 Malignant neoplasm of prostate: Secondary | ICD-10-CM

## 2022-10-28 MED ORDER — ABIRATERONE ACETATE 250 MG PO TABS
1000.0000 mg | ORAL_TABLET | Freq: Every day | ORAL | 3 refills | Status: DC
Start: 2022-10-28 — End: 2023-03-07
  Filled 2022-10-29: qty 120, 30d supply, fill #0
  Filled 2022-12-06: qty 120, 30d supply, fill #1
  Filled 2023-01-06: qty 120, 30d supply, fill #2
  Filled 2023-02-09: qty 120, 30d supply, fill #3

## 2022-10-29 ENCOUNTER — Other Ambulatory Visit: Payer: Self-pay

## 2022-11-05 ENCOUNTER — Encounter: Payer: Self-pay | Admitting: Oncology

## 2022-11-05 ENCOUNTER — Inpatient Hospital Stay: Payer: Medicare Other | Admitting: Oncology

## 2022-11-05 ENCOUNTER — Inpatient Hospital Stay: Payer: Medicare Other | Attending: Oncology

## 2022-11-05 ENCOUNTER — Inpatient Hospital Stay: Payer: Medicare Other

## 2022-11-05 VITALS — BP 164/66 | HR 76 | Temp 94.0°F | Resp 18 | Ht 65.0 in | Wt 168.0 lb

## 2022-11-05 DIAGNOSIS — C61 Malignant neoplasm of prostate: Secondary | ICD-10-CM | POA: Diagnosis not present

## 2022-11-05 DIAGNOSIS — C7951 Secondary malignant neoplasm of bone: Secondary | ICD-10-CM | POA: Insufficient documentation

## 2022-11-05 DIAGNOSIS — Z191 Hormone sensitive malignancy status: Secondary | ICD-10-CM

## 2022-11-05 DIAGNOSIS — I10 Essential (primary) hypertension: Secondary | ICD-10-CM | POA: Diagnosis not present

## 2022-11-05 DIAGNOSIS — Z5111 Encounter for antineoplastic chemotherapy: Secondary | ICD-10-CM | POA: Diagnosis present

## 2022-11-05 DIAGNOSIS — Z79899 Other long term (current) drug therapy: Secondary | ICD-10-CM

## 2022-11-05 LAB — CBC WITH DIFFERENTIAL/PLATELET
Abs Immature Granulocytes: 0.09 10*3/uL — ABNORMAL HIGH (ref 0.00–0.07)
Basophils Absolute: 0 10*3/uL (ref 0.0–0.1)
Basophils Relative: 0 %
Eosinophils Absolute: 0.1 10*3/uL (ref 0.0–0.5)
Eosinophils Relative: 1 %
HCT: 38.9 % — ABNORMAL LOW (ref 39.0–52.0)
Hemoglobin: 12.4 g/dL — ABNORMAL LOW (ref 13.0–17.0)
Immature Granulocytes: 1 %
Lymphocytes Relative: 16 %
Lymphs Abs: 1.3 10*3/uL (ref 0.7–4.0)
MCH: 29 pg (ref 26.0–34.0)
MCHC: 31.9 g/dL (ref 30.0–36.0)
MCV: 91.1 fL (ref 80.0–100.0)
Monocytes Absolute: 1.2 10*3/uL — ABNORMAL HIGH (ref 0.1–1.0)
Monocytes Relative: 14 %
Neutro Abs: 5.8 10*3/uL (ref 1.7–7.7)
Neutrophils Relative %: 68 %
Platelets: 200 10*3/uL (ref 150–400)
RBC: 4.27 MIL/uL (ref 4.22–5.81)
RDW: 15 % (ref 11.5–15.5)
WBC: 8.6 10*3/uL (ref 4.0–10.5)
nRBC: 0 % (ref 0.0–0.2)

## 2022-11-05 LAB — COMPREHENSIVE METABOLIC PANEL
ALT: 14 U/L (ref 0–44)
AST: 20 U/L (ref 15–41)
Albumin: 4.2 g/dL (ref 3.5–5.0)
Alkaline Phosphatase: 78 U/L (ref 38–126)
Anion gap: 9 (ref 5–15)
BUN: 22 mg/dL (ref 8–23)
CO2: 23 mmol/L (ref 22–32)
Calcium: 9 mg/dL (ref 8.9–10.3)
Chloride: 103 mmol/L (ref 98–111)
Creatinine, Ser: 1.02 mg/dL (ref 0.61–1.24)
GFR, Estimated: >60 mL/min (ref >60)
Glucose, Bld: 104 mg/dL — ABNORMAL HIGH (ref 70–99)
Potassium: 3.8 mmol/L (ref 3.5–5.1)
Sodium: 135 mmol/L (ref 135–145)
Total Bilirubin: 1.1 mg/dL (ref 0.3–1.2)
Total Protein: 7.5 g/dL (ref 6.5–8.1)

## 2022-11-05 LAB — PSA: Prostatic Specific Antigen: <0.01 ng/mL (ref 0.00–4.00)

## 2022-11-05 MED ORDER — LEUPROLIDE ACETATE (6 MONTH) 45 MG ~~LOC~~ KIT
45.0000 mg | PACK | Freq: Once | SUBCUTANEOUS | Status: AC
  Administered 2022-11-05: 45 mg via SUBCUTANEOUS

## 2022-11-05 NOTE — Progress Notes (Signed)
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 Allena Katz for symptoms of left hip pain which prompted x-rays followed by MRI of the left hip without contrast.  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  Patient was subsequently seen by orthopedics Dr. Pathology who did not recommend any orthopedic intervention for the left hip fracture he has been referred to oncology for further management patient's last PSA was checked in 2014 which was elevated at 7.4.  Patient is also a chronic smoker and smokes about 1 pack of cigarettes per day since 1968.   Head CT scan showed widespread bony metastatic disease with pathologic fracture of the left acetabulum.  Pathologic fracture of the T3 vertebral body with 50% loss of height possible fracture of the left seventh rib.  No evidence of visceral metastases.  PSA elevated at 35.  Bone biopsy consistent with prostate adenocarcinoma.  Unable to obtain NGS testing on bone sample   Patient received Deborra Medina and is currently on Zytiga.  Zytiga started in November 2021   Patient also seen at [facility] for second opinion related with [facility] and ADT.  They recommended bisphosphonates given pathologic fracture of the acetabulum.  However patient did  not wish to take it given the risk of osteonecrosis of the jaw    Interval history-patient had a fall a couple of weeks ago when he was doing some yard work.  Denies any major injuries.  Denies any significant pain presently.  He was switched from lisinopril to amlodipine for his blood pressure and his blood pressure readings at home have been mostly between 140s to 160s systolic per his wife.  ECOG PS- 2 Pain scale- 0 Opioid associated constipation- no  Review of systems- Review of Systems  Constitutional:  Positive for malaise/fatigue. Negative for chills, fever and weight loss.  HENT:  Negative for congestion, ear discharge and nosebleeds.   Eyes:  Negative for blurred vision.  Respiratory:  Negative for cough, hemoptysis, sputum production, shortness of breath and wheezing.   Cardiovascular:  Negative for chest pain, palpitations, orthopnea and claudication.  Gastrointestinal:  Negative for abdominal pain, blood in stool, constipation, diarrhea, heartburn, melena, nausea and vomiting.  Genitourinary:  Negative for dysuria, flank pain, frequency, hematuria and urgency.  Musculoskeletal:  Negative for back pain, joint pain and myalgias.  Skin:  Negative for rash.  Neurological:  Negative for dizziness, tingling, focal weakness, seizures, weakness and headaches.  Endo/Heme/Allergies:  Does not bruise/bleed easily.  Psychiatric/Behavioral:  Negative for depression and suicidal ideas. The patient does not have insomnia.       Allergies  Allergen Reactions   Citalopram Other (See Comments)    Unsure but family remembers that he could not take the dru-not sure what happened     Past Medical History:  Diagnosis Date   Hypertension    Prostate cancer (HCC)  History reviewed. No pertinent surgical history.  Social History   Socioeconomic History   Marital status: Married    Spouse name: Not on file   Number of children: Not on file   Years of education: Not on file    Highest education level: Not on file  Occupational History   Not on file  Tobacco Use   Smoking status: Former    Types: Pipe   Smokeless tobacco: Former   Tobacco comments:    stopped pipes 3 month ago  Vaping Use   Vaping status: Never Used  Substance and Sexual Activity   Alcohol use: Not Currently   Drug use: Never   Sexual activity: Not on file  Other Topics Concern   Not on file  Social History Narrative   Not on file   Social Determinants of Health   Financial Resource Strain: Not on file  Food Insecurity: Not on file  Transportation Needs: Not on file  Physical Activity: Not on file  Stress: Not on file  Social Connections: Unknown (07/31/2021)   [facility] from [facility]   Social Network    Social Network: Not on file  Intimate Partner Violence: Unknown (06/22/2021)   Received from [facility]   HITS    Physically Hurt: Not on file    Insult or Talk Down To: Not on file    Threaten Physical Harm: Not on file    Scream or Curse: Not on file    History reviewed. No pertinent family history.   Current Outpatient Medications:    abiraterone acetate (ZYTIGA) 250 MG tablet, Take 4 tablets (1,000 mg total) by mouth daily., Disp: 120 tablet, Rfl: 3   amLODipine (NORVASC) 5 MG tablet, Take 5 mg by mouth daily., Disp: , Rfl:    predniSONE (DELTASONE) 5 MG tablet, Take 1 tablet by mouth once daily with breakfast, Disp: 90 tablet, Rfl: 0   senna (SENOKOT) 8.6 MG tablet, Take by mouth., Disp: , Rfl:    alendronate (FOSAMAX) 70 MG tablet, Take 1 tablet (70 mg total) by mouth once a week. Take with a full glass of water on an empty stomach. (Patient not taking: Reported on 05/07/2022), Disp: 12 tablet, Rfl: 0   cyanocobalamin 1000 MCG tablet, Take by mouth. (Patient not taking: Reported on 11/05/2022), Disp: , Rfl:    Docusate Sodium (DSS) 100 MG CAPS, Take by mouth. (Patient not taking: Reported on 10/20/2021), Disp: , Rfl:    donepezil (ARICEPT) 5 MG tablet, Take by mouth.  (Patient not taking: Reported on 11/05/2022), Disp: , Rfl:    lisinopril (ZESTRIL) 20 MG tablet, Take 20 mg by mouth daily., Disp: , Rfl:    Omega-3 Fatty Acids (FISH OIL) 1200 MG CAPS, Take 1,200 mg by mouth daily. (Patient not taking: Reported on 01/20/2022), Disp: , Rfl:    zinc gluconate 50 MG tablet, Take 50 mg by mouth daily. (Patient not taking: Reported on 11/05/2022), Disp: , Rfl:  No current facility-administered medications for this visit.  Facility-Administered Medications Ordered in Other Visits:    Leuprolide Acetate (3 Month) (ELIGARD) 22.5 MG injection 22.5 mg, 22.5 mg, Subcutaneous, Q90 days, Creig Hines, MD, 22.5 mg at 05/06/20 1116   Leuprolide Acetate (3 Month) (ELIGARD) 22.5 MG injection 22.5 mg, 22.5 mg, Subcutaneous, Q90 days, Creig Hines, MD, 22.5 mg at 08/04/20 1044  Physical exam:  Vitals:   11/05/22 1122 11/05/22 1126  BP: (!) 163/71 (!) 164/66  Pulse: 76 76  Resp: 18  Temp: (!) 94 F (34.4 C)   TempSrc: Tympanic   SpO2: 100%   Weight: 168 lb (76.2 kg)   Height: 5\' 5"  (1.651 m)    Physical Exam Cardiovascular:     Rate and Rhythm: Normal rate and regular rhythm.     Heart sounds: Normal heart sounds.  Pulmonary:     Effort: Pulmonary effort is normal.     Breath sounds: Normal breath sounds.  Abdominal:     General: Bowel sounds are normal.     Palpations: Abdomen is soft.  Musculoskeletal:     Right lower leg: No edema.     Left lower leg: No edema.  Skin:    General: Skin is warm and dry.  Neurological:     Mental Status: He is alert and oriented to person, place, and time.         Latest Ref Rng & Units 11/05/2022   11:03 AM  CMP  Glucose 70 - 99 mg/dL 914   BUN 8 - 23 mg/dL 22   Creatinine 7.82 - 1.24 mg/dL 9.56   Sodium 213 - 086 mmol/L 135   Potassium 3.5 - 5.1 mmol/L 3.8   Chloride 98 - 111 mmol/L 103   CO2 22 - 32 mmol/L 23   Calcium 8.9 - 10.3 mg/dL 9.0   Total Protein 6.5 - 8.1 g/dL 7.5   Total Bilirubin 0.3 - 1.2 mg/dL  1.1   Alkaline Phos 38 - 126 U/L 78   AST 15 - 41 U/L 20   ALT 0 - 44 U/L 14       Latest Ref Rng & Units 11/05/2022   11:03 AM  CBC  WBC 4.0 - 10.5 K/uL 8.6   Hemoglobin 13.0 - 17.0 g/dL 57.8   Hematocrit 46.9 - 52.0 % 38.9   Platelets 150 - 400 K/uL 200      Assessment and plan- Patient is a 87 y.o. 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  PSA from today is pending.  Last PSA6 months ago was undetectable.  He will continue with ADT plus Zytiga until progression or toxicity.  I will repeat CBC with differential CMP PSA in 3 months in 6 months and see him back in 6 months.  Hypertension: May be potentially worse due to [facility].  Presently on amlodipine 5 mg daily with systolic blood pressures between 140s to 160s.  I have asked the patient's wife to keep a log of his blood pressure and reach out to Dr. Burnadette Pop if his amlodipine dose needs to be adjusted further   Visit Diagnosis 1. High risk medication use   2. Adenocarcinoma of prostate [facility])      Dr. Owens Shark, MD, MPH [facility] [phone] 11/05/2022 1:23 PM

## 2022-11-10 ENCOUNTER — Other Ambulatory Visit: Payer: Self-pay

## 2022-11-10 ENCOUNTER — Other Ambulatory Visit (HOSPITAL_BASED_OUTPATIENT_CLINIC_OR_DEPARTMENT_OTHER): Payer: Self-pay

## 2022-11-10 ENCOUNTER — Other Ambulatory Visit (HOSPITAL_COMMUNITY): Payer: Self-pay

## 2022-12-06 ENCOUNTER — Other Ambulatory Visit (HOSPITAL_COMMUNITY): Payer: Self-pay

## 2022-12-21 ENCOUNTER — Other Ambulatory Visit: Payer: Self-pay | Admitting: Oncology

## 2022-12-21 DIAGNOSIS — C61 Malignant neoplasm of prostate: Secondary | ICD-10-CM

## 2022-12-21 MED ORDER — PREDNISONE 5 MG PO TABS
5.0000 mg | ORAL_TABLET | Freq: Every day | ORAL | 0 refills | Status: DC
Start: 2022-12-21 — End: 2023-03-17

## 2023-01-06 ENCOUNTER — Other Ambulatory Visit (HOSPITAL_COMMUNITY): Payer: Self-pay

## 2023-01-06 ENCOUNTER — Other Ambulatory Visit (HOSPITAL_COMMUNITY): Payer: Self-pay | Admitting: Pharmacy Technician

## 2023-01-06 NOTE — Progress Notes (Signed)
Specialty Pharmacy Refill Coordination Note  Alexis Cruz is a 87 y.o. male contacted today regarding refills of specialty medication(s) Abiraterone Acetate   Patient requested Delivery   Delivery date: 01/13/23   Verified address: [address]  [facility] [place]   Medication will be filled on 01/12/23.

## 2023-02-04 ENCOUNTER — Inpatient Hospital Stay: Payer: Medicare Other | Attending: Oncology

## 2023-02-04 DIAGNOSIS — Z79899 Other long term (current) drug therapy: Secondary | ICD-10-CM

## 2023-02-04 DIAGNOSIS — C61 Malignant neoplasm of prostate: Secondary | ICD-10-CM | POA: Diagnosis present

## 2023-02-04 LAB — CBC WITH DIFFERENTIAL/PLATELET
Abs Immature Granulocytes: 0.05 10*3/uL (ref 0.00–0.07)
Basophils Absolute: 0 10*3/uL (ref 0.0–0.1)
Basophils Relative: 0 %
Eosinophils Absolute: 0.1 10*3/uL (ref 0.0–0.5)
Eosinophils Relative: 2 %
HCT: 37.2 % — ABNORMAL LOW (ref 39.0–52.0)
Hemoglobin: 11.8 g/dL — ABNORMAL LOW (ref 13.0–17.0)
Immature Granulocytes: 1 %
Lymphocytes Relative: 21 %
Lymphs Abs: 1.4 10*3/uL (ref 0.7–4.0)
MCH: 29.1 pg (ref 26.0–34.0)
MCHC: 31.7 g/dL (ref 30.0–36.0)
MCV: 91.6 fL (ref 80.0–100.0)
Monocytes Absolute: 1.4 10*3/uL — ABNORMAL HIGH (ref 0.1–1.0)
Monocytes Relative: 21 %
Neutro Abs: 3.7 10*3/uL (ref 1.7–7.7)
Neutrophils Relative %: 55 %
Platelets: 197 10*3/uL (ref 150–400)
RBC: 4.06 MIL/uL — ABNORMAL LOW (ref 4.22–5.81)
RDW: 15.2 % (ref 11.5–15.5)
Smear Review: NORMAL
WBC: 6.6 10*3/uL (ref 4.0–10.5)
nRBC: 0 % (ref 0.0–0.2)

## 2023-02-04 LAB — COMPREHENSIVE METABOLIC PANEL
ALT: 14 U/L (ref 0–44)
AST: 19 U/L (ref 15–41)
Albumin: 4.1 g/dL (ref 3.5–5.0)
Alkaline Phosphatase: 78 U/L (ref 38–126)
Anion gap: 9 (ref 5–15)
BUN: 26 mg/dL — ABNORMAL HIGH (ref 8–23)
CO2: 24 mmol/L (ref 22–32)
Calcium: 9.1 mg/dL (ref 8.9–10.3)
Chloride: 103 mmol/L (ref 98–111)
Creatinine, Ser: 1.18 mg/dL (ref 0.61–1.24)
GFR, Estimated: >60 mL/min (ref >60)
Glucose, Bld: 104 mg/dL — ABNORMAL HIGH (ref 70–99)
Potassium: 4.6 mmol/L (ref 3.5–5.1)
Sodium: 136 mmol/L (ref 135–145)
Total Bilirubin: 0.5 mg/dL (ref <1.2)
Total Protein: 7.1 g/dL (ref 6.5–8.1)

## 2023-02-04 LAB — PSA: Prostatic Specific Antigen: <0.01 ng/mL (ref 0.00–4.00)

## 2023-02-09 ENCOUNTER — Other Ambulatory Visit: Payer: Self-pay

## 2023-02-09 NOTE — Progress Notes (Signed)
Specialty Pharmacy Refill Coordination Note  Alexis Cruz is a 87 y.o. male contacted today regarding refills of specialty medication(s) Abiraterone Acetate   Patient requested Delivery   Delivery date: 02/16/23   Verified address: [address]  [facility] [place]   Medication will be filled on 02/15/23.

## 2023-02-15 ENCOUNTER — Other Ambulatory Visit: Payer: Self-pay

## 2023-03-07 ENCOUNTER — Other Ambulatory Visit: Payer: Self-pay

## 2023-03-07 ENCOUNTER — Other Ambulatory Visit: Payer: Self-pay | Admitting: Oncology

## 2023-03-07 DIAGNOSIS — C61 Malignant neoplasm of prostate: Secondary | ICD-10-CM

## 2023-03-07 MED ORDER — ABIRATERONE ACETATE 250 MG PO TABS
1000.0000 mg | ORAL_TABLET | Freq: Every day | ORAL | 3 refills | Status: DC
  Filled 2023-03-07: qty 120, 30d supply, fill #0
  Filled 2023-04-07: qty 120, 30d supply, fill #1
  Filled 2023-05-02: qty 120, 30d supply, fill #2
  Filled 2023-06-06: qty 120, 30d supply, fill #3

## 2023-03-07 NOTE — Progress Notes (Addendum)
Specialty Pharmacy Refill Coordination Note  Alexis Cruz is a 87 y.o. male contacted today regarding refills of specialty medication(s) Abiraterone Acetate [name]   Patient requested Delivery   Delivery date: 03/15/23   Verified address: [address] [place] [address]   Medication will be filled on 03/14/23.   Pending refill request-- call if any delays

## 2023-03-07 NOTE — Progress Notes (Signed)
 Pending Refill Request completed.

## 2023-03-07 NOTE — Telephone Encounter (Signed)
CBC w/auto Differential (5 Part) Order: [id number] Component Ref Range & Units 6 d ago  WBC (White Blood Cell Count) 4.1 - 10.2 10^3/uL 6.8  RBC (Red Blood Cell Count) 4.69 - 6.13 10^6/uL 3.98 Low   Hemoglobin 14.1 - 18.1 gm/dL 40.9 Low   Hematocrit 81.1 - 52.0 % 37 Low   MCV (Mean Corpuscular Volume) 80.0 - 100.0 fl 93  MCH (Mean Corpuscular Hemoglobin) 27.0 - 31.2 pg 29.9  MCHC (Mean Corpuscular Hemoglobin Concentration) 32.0 - 36.0 gm/dL 91.4  Platelet Count 782 - 450 10^3/uL 196  RDW-CV (Red Cell Distribution Width) 11.6 - 14.8 % 15.2 High   MPV (Mean Platelet Volume) 9.4 - 12.4 fl 10.6  Neutrophils 1.50 - 7.80 10^3/uL 3.85  Lymphocytes 1.00 - 3.60 10^3/uL 1.35  Monocytes 0.00 - 1.50 10^3/uL 1.44  Eosinophils 0.00 - 0.55 10^3/uL 0.14  Basophils 0.00 - 0.09 10^3/uL 0.02  Neutrophil % 32.0 - 70.0 % 56.4  Lymphocyte % 10.0 - 50.0 % 19.8  Monocyte % 4.0 - 13.0 % 21.1 High   Eosinophil % 1.0 - 5.0 % 2  Basophil% 0.0 - 2.0 % 0.3  Immature Granulocyte % <=0.7 % 0.4  Immature Granulocyte Count <=0.06 10^3/L 0.03  Resulting Agency [facility]  Narrative Performed by [facility] Smear review agrees with analyzer results  Specimen Collected: 03/01/23 09:17   Performed by: [facility] Last Resulted: 03/01/23 14:46  Received From: [facility]  Result Received: 03/07/23 13:34  Comprehensive Metabolic Panel (CMP) Order: [id number] Component Ref Range & Units 6 d ago  Glucose 70 - 110 mg/dL 578  Sodium 469 - 629 mmol/L 138  Potassium 3.6 - 5.1 mmol/L 4.6  Chloride 97 - 109 mmol/L 103  Carbon Dioxide (CO2) 22.0 - 32.0 mmol/L 27.1  Urea Nitrogen (BUN) 7 - 25 mg/dL 18  Creatinine 0.7 - 1.3 mg/dL 1.1  Glomerular Filtration Rate (eGFR) >60 mL/min/1.73sq m 66  Comment: CKD-EPI (2021) does not include patient's race in the calculation of eGFR.  Monitoring changes of plasma creatinine and eGFR over  time is useful for monitoring kidney function.  Interpretive Ranges for eGFR (CKD-EPI 2021):  eGFR:       >60 mL/min/1.73 sq. m - Normal eGFR:       30-59 mL/min/1.73 sq. m - Moderately Decreased eGFR:       15-29 mL/min/1.73 sq. m  - Severely Decreased eGFR:       < 15 mL/min/1.73 sq. m  - Kidney Failure   Note: These eGFR calculations do not apply in acute situations when eGFR is changing rapidly or patients on dialysis.  Calcium 8.7 - 10.3 mg/dL 9.1  AST 8 - 39 U/L 18  ALT 6 - 57 U/L 13  Alk Phos (alkaline Phosphatase) 34 - 104 U/L 77  Albumin 3.5 - 4.8 g/dL 4.4  Bilirubin, Total 0.3 - 1.2 mg/dL 0.7  Protein, Total 6.1 - 7.9 g/dL 6.7  A/G Ratio 1.0 - 5.0 gm/dL 1.9  Resulting Agency [facility]   Specimen Collected: 03/01/23 09:17   Performed by: [facility] Last Resulted: 03/01/23 11:19  Received From: [facility]  Result Received: 03/07/23 13:34

## 2023-03-14 ENCOUNTER — Other Ambulatory Visit: Payer: Self-pay

## 2023-03-17 ENCOUNTER — Other Ambulatory Visit: Payer: Self-pay | Admitting: Oncology

## 2023-03-17 DIAGNOSIS — Z191 Hormone sensitive malignancy status: Secondary | ICD-10-CM

## 2023-04-07 ENCOUNTER — Other Ambulatory Visit: Payer: Self-pay

## 2023-04-07 NOTE — Progress Notes (Signed)
Specialty Pharmacy Refill Coordination Note  Alexis Cruz is a 88 y.o. male contacted today regarding refills of specialty medication(s) Abiraterone Acetate (ZYTIGA) Spoke with patient's wife  Patient requested Delivery   Delivery date: 04/12/23   Verified address: [address] [place] [address]   Medication will be filled on 01.20.25.

## 2023-04-08 ENCOUNTER — Other Ambulatory Visit: Payer: Self-pay

## 2023-04-11 ENCOUNTER — Other Ambulatory Visit: Payer: Self-pay

## 2023-04-29 ENCOUNTER — Ambulatory Visit
Admission: RE | Admit: 2023-04-29 | Discharge: 2023-04-29 | Disposition: A | Discharge status: Home / Self Care | Payer: Medicare Other | Source: Ambulatory Visit | Attending: Oncology

## 2023-04-29 ENCOUNTER — Encounter
Admission: RE | Admit: 2023-04-29 | Discharge: 2023-04-29 | Disposition: A | Discharge status: Home / Self Care | Payer: Medicare Other | Source: Ambulatory Visit | Attending: Oncology | Admitting: Oncology

## 2023-04-29 DIAGNOSIS — C61 Malignant neoplasm of prostate: Secondary | ICD-10-CM | POA: Insufficient documentation

## 2023-04-29 DIAGNOSIS — Z79899 Other long term (current) drug therapy: Secondary | ICD-10-CM

## 2023-04-29 LAB — POCT I-STAT CREATININE: Creatinine, Ser: 1.2 mg/dL (ref 0.61–1.24)

## 2023-04-29 MED ORDER — TECHNETIUM TC 99M MEDRONATE IV KIT
20.0000 | PACK | Freq: Once | INTRAVENOUS | Status: AC | PRN
  Administered 2023-04-29: 21.64 via INTRAVENOUS

## 2023-04-29 MED ORDER — IOHEXOL 300 MG/ML  SOLN
100.0000 mL | Freq: Once | INTRAMUSCULAR | Status: AC | PRN
  Administered 2023-04-29: 100 mL via INTRAVENOUS

## 2023-05-02 ENCOUNTER — Other Ambulatory Visit (HOSPITAL_COMMUNITY): Payer: Self-pay

## 2023-05-02 ENCOUNTER — Other Ambulatory Visit: Payer: Self-pay

## 2023-05-02 NOTE — Progress Notes (Signed)
 Specialty Pharmacy Refill Coordination Note  Alexis Cruz is a 88 y.o. male contacted today regarding refills of specialty medication(s) Abiraterone  Acetate (ZYTIGA )   Patient requested Delivery   Delivery date: 05/13/23   Verified address: [address] [name] [address]   Medication will be filled on 05/12/23.

## 2023-05-10 ENCOUNTER — Inpatient Hospital Stay: Payer: Medicare Other

## 2023-05-10 ENCOUNTER — Inpatient Hospital Stay: Payer: Medicare Other | Attending: Oncology

## 2023-05-10 ENCOUNTER — Inpatient Hospital Stay (HOSPITAL_BASED_OUTPATIENT_CLINIC_OR_DEPARTMENT_OTHER): Payer: Medicare Other | Admitting: Oncology

## 2023-05-10 ENCOUNTER — Encounter: Payer: Self-pay | Admitting: Oncology

## 2023-05-10 VITALS — BP 139/61 | HR 73 | Temp 97.5°F | Resp 18 | Wt 170.8 lb

## 2023-05-10 DIAGNOSIS — C61 Malignant neoplasm of prostate: Secondary | ICD-10-CM | POA: Insufficient documentation

## 2023-05-10 DIAGNOSIS — C7951 Secondary malignant neoplasm of bone: Secondary | ICD-10-CM | POA: Insufficient documentation

## 2023-05-10 DIAGNOSIS — Z5181 Encounter for therapeutic drug level monitoring: Secondary | ICD-10-CM

## 2023-05-10 DIAGNOSIS — Z5111 Encounter for antineoplastic chemotherapy: Secondary | ICD-10-CM | POA: Diagnosis present

## 2023-05-10 DIAGNOSIS — Z79899 Other long term (current) drug therapy: Secondary | ICD-10-CM

## 2023-05-10 DIAGNOSIS — M8008XA Age-related osteoporosis with current pathological fracture, vertebra(e), initial encounter for fracture: Secondary | ICD-10-CM | POA: Insufficient documentation

## 2023-05-10 DIAGNOSIS — Z79818 Long term (current) use of other agents affecting estrogen receptors and estrogen levels: Secondary | ICD-10-CM

## 2023-05-10 DIAGNOSIS — Z191 Hormone sensitive malignancy status: Secondary | ICD-10-CM

## 2023-05-10 LAB — COMPREHENSIVE METABOLIC PANEL
ALT: 12 U/L (ref 0–44)
AST: 19 U/L (ref 15–41)
Albumin: 4.3 g/dL (ref 3.5–5.0)
Alkaline Phosphatase: 72 U/L (ref 38–126)
Anion gap: 9 (ref 5–15)
BUN: 22 mg/dL (ref 8–23)
CO2: 23 mmol/L (ref 22–32)
Calcium: 9.1 mg/dL (ref 8.9–10.3)
Chloride: 101 mmol/L (ref 98–111)
Creatinine, Ser: 1.09 mg/dL (ref 0.61–1.24)
GFR, Estimated: >60 mL/min (ref >60)
Glucose, Bld: 105 mg/dL — ABNORMAL HIGH (ref 70–99)
Potassium: 4.7 mmol/L (ref 3.5–5.1)
Sodium: 133 mmol/L — ABNORMAL LOW (ref 135–145)
Total Bilirubin: 0.8 mg/dL (ref 0.0–1.2)
Total Protein: 7.6 g/dL (ref 6.5–8.1)

## 2023-05-10 LAB — CBC WITH DIFFERENTIAL/PLATELET
Abs Immature Granulocytes: 0.07 10*3/uL (ref 0.00–0.07)
Basophils Absolute: 0 10*3/uL (ref 0.0–0.1)
Basophils Relative: 0 %
Eosinophils Absolute: 0.1 10*3/uL (ref 0.0–0.5)
Eosinophils Relative: 1 %
HCT: 38 % — ABNORMAL LOW (ref 39.0–52.0)
Hemoglobin: 12.3 g/dL — ABNORMAL LOW (ref 13.0–17.0)
Immature Granulocytes: 1 %
Lymphocytes Relative: 11 %
Lymphs Abs: 1 10*3/uL (ref 0.7–4.0)
MCH: 29.6 pg (ref 26.0–34.0)
MCHC: 32.4 g/dL (ref 30.0–36.0)
MCV: 91.6 fL (ref 80.0–100.0)
Monocytes Absolute: 1.2 10*3/uL — ABNORMAL HIGH (ref 0.1–1.0)
Monocytes Relative: 13 %
Neutro Abs: 7.1 10*3/uL (ref 1.7–7.7)
Neutrophils Relative %: 74 %
Platelets: 207 10*3/uL (ref 150–400)
RBC: 4.15 MIL/uL — ABNORMAL LOW (ref 4.22–5.81)
RDW: 15.2 % (ref 11.5–15.5)
Smear Review: NORMAL
WBC: 9.4 10*3/uL (ref 4.0–10.5)
nRBC: 0 % (ref 0.0–0.2)

## 2023-05-10 LAB — PSA: Prostatic Specific Antigen: <0.01 ng/mL (ref 0.00–4.00)

## 2023-05-10 MED ORDER — LEUPROLIDE ACETATE (6 MONTH) 45 MG ~~LOC~~ KIT
45.0000 mg | PACK | Freq: Once | SUBCUTANEOUS | Status: AC
  Administered 2023-05-10: 45 mg via SUBCUTANEOUS
  Filled 2023-05-10: qty 45

## 2023-05-11 ENCOUNTER — Encounter: Payer: Self-pay | Admitting: Oncology

## 2023-05-11 NOTE — Progress Notes (Signed)
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 Allena Katz for symptoms of left hip pain which prompted x-rays followed by MRI of the left hip without contrast.  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  Patient was subsequently seen by orthopedics Dr. Pathology who did not recommend any orthopedic intervention for the left hip fracture he has been referred to oncology for further management patient's last PSA was checked in 2014 which was elevated at 7.4.  Patient is also a chronic smoker and smokes about 1 pack of cigarettes per day since 1968.   Head CT scan showed widespread bony metastatic disease with pathologic fracture of the left acetabulum.  Pathologic fracture of the T3 vertebral body with 50% loss of height possible fracture of the left seventh rib.  No evidence of visceral metastases.  PSA elevated at 35.  Bone biopsy consistent with prostate adenocarcinoma.  Unable to obtain NGS testing on bone sample   Patient received Deborra Medina and is currently on Zytiga.  Zytiga started in November 2021   Patient also seen at [facility] for second opinion related with [facility] and ADT.  They recommended bisphosphonates given pathologic fracture of the acetabulum.  However patient did not wish to  take it given the risk of osteonecrosis of the jaw      Interval history-he is doing well for his age.  He takes his Zytiga consistently and has not missed any doses.  We had discussed weekly Fosamax givenAnd that he had evidence of osteoporosis on his bone density scan in October 2023 but he has not taken it so far.  ECOG PS- 2 Pain scale- 0   Review of systems- Review of Systems  Constitutional:  Negative for chills, fever, malaise/fatigue and weight loss.  HENT:  Negative for congestion, ear discharge and nosebleeds.   Eyes:  Negative for blurred vision.  Respiratory:  Negative for cough, hemoptysis, sputum production, shortness of breath and wheezing.   Cardiovascular:  Negative for chest pain, palpitations, orthopnea and claudication.  Gastrointestinal:  Negative for abdominal pain, blood in stool, constipation, diarrhea, heartburn, melena, nausea and vomiting.  Genitourinary:  Negative for dysuria, flank pain, frequency, hematuria and urgency.  Musculoskeletal:  Negative for back pain, joint pain and myalgias.  Skin:  Negative for rash.  Neurological:  Negative for dizziness, tingling, focal weakness, seizures, weakness and headaches.  Endo/Heme/Allergies:  Does not bruise/bleed easily.  Psychiatric/Behavioral:  Negative for depression and suicidal ideas. The patient does not have insomnia.       Allergies  Allergen Reactions   Citalopram Other (See Comments)    Unsure but family remembers that he could not take the dru-not sure what happened     Past Medical History:  Diagnosis Date   Hypertension    Prostate cancer (HCC)      History reviewed. No pertinent surgical history.  Social History  Socioeconomic History   Marital status: Married    Spouse name: Not on file   Number of children: Not on file   Years of education: Not on file   Highest education level: Not on file  Occupational History   Not on file  Tobacco Use   Smoking status: Former    Types:  Pipe   Smokeless tobacco: Former   Tobacco comments:    stopped pipes 3 month ago  Vaping Use   Vaping status: Never Used  Substance and Sexual Activity   Alcohol use: Not Currently   Drug use: Never   Sexual activity: Not on file  Other Topics Concern   Not on file  Social History Narrative   Not on file   Social Drivers of Health   Financial Resource Strain: Not on file  Food Insecurity: Not on file  Transportation Needs: Not on file  Physical Activity: Not on file  Stress: Not on file  Social Connections: Unknown (07/31/2021)   Received from [facility]   Social Network    Social Network: Not on file  Intimate Partner Violence: Unknown (06/22/2021)   Received from [facility], [facility]   HITS    Physically Hurt: Not on file    Insult or Talk Down To: Not on file    Threaten Physical Harm: Not on file    Scream or Curse: Not on file    History reviewed. No pertinent family history.   Current Outpatient Medications:    abiraterone acetate (ZYTIGA) 250 MG tablet, Take 4 tablets (1,000 mg total) by mouth daily., Disp: 120 tablet, Rfl: 3   alendronate (FOSAMAX) 70 MG tablet, Take 1 tablet (70 mg total) by mouth once a week. Take with a full glass of water on an empty stomach. (Patient not taking: Reported on 05/07/2022), Disp: 12 tablet, Rfl: 0   amLODipine (NORVASC) 5 MG tablet, Take 5 mg by mouth daily., Disp: , Rfl:    cyanocobalamin 1000 MCG tablet, Take by mouth. (Patient not taking: Reported on 11/05/2022), Disp: , Rfl:    Docusate Sodium (DSS) 100 MG CAPS, Take by mouth. (Patient not taking: Reported on 10/20/2021), Disp: , Rfl:    donepezil (ARICEPT) 5 MG tablet, Take by mouth. (Patient not taking: Reported on 11/05/2022), Disp: , Rfl:    hydrALAZINE (APRESOLINE) 25 MG tablet, Take by mouth. (Patient not taking: Reported on 05/10/2023), Disp: , Rfl:    Omega-3 Fatty Acids (FISH OIL) 1200 MG CAPS, Take 1,200 mg by mouth daily. (Patient not taking:  Reported on 01/20/2022), Disp: , Rfl:    predniSONE (DELTASONE) 5 MG tablet, Take 1 tablet by mouth once daily with breakfast, Disp: 90 tablet, Rfl: 0   senna (SENOKOT) 8.6 MG tablet, Take by mouth., Disp: , Rfl:    traMADol (ULTRAM) 50 MG tablet, Take 50 mg by mouth every 6 (six) hours as needed. (Patient not taking: Reported on 05/10/2023), Disp: , Rfl:    zinc gluconate 50 MG tablet, Take 50 mg by mouth daily. (Patient not taking: Reported on 11/05/2022), Disp: , Rfl:  No current facility-administered medications for this visit.  Facility-Administered Medications Ordered in Other Visits:    Leuprolide Acetate (3 Month) (ELIGARD) 22.5 MG injection 22.5 mg, 22.5 mg, Subcutaneous, Q90 days, Creig Hines, MD, 22.5 mg at 05/06/20 1116   Leuprolide Acetate (3 Month) (ELIGARD) 22.5 MG injection 22.5 mg, 22.5 mg, Subcutaneous, Q90 days, Creig Hines, MD, 22.5 mg at 08/04/20 1044  Physical  exam:  Vitals:   05/10/23 1113  BP: 139/61  Pulse: 73  Resp: 18  Temp: (!) 97.5 F (36.4 C)  TempSrc: Tympanic  SpO2: 98%  Weight: 170 lb 12.8 oz (77.5 kg)   Physical Exam Cardiovascular:     Rate and Rhythm: Normal rate and regular rhythm.     Heart sounds: Normal heart sounds.  Pulmonary:     Effort: Pulmonary effort is normal.     Breath sounds: Normal breath sounds.  Abdominal:     General: Bowel sounds are normal.     Palpations: Abdomen is soft.  Skin:    General: Skin is warm and dry.  Neurological:     Mental Status: He is alert and oriented to person, place, and time.         Latest Ref Rng & Units 05/10/2023   10:52 AM  CMP  Glucose 70 - 99 mg/dL 161   BUN 8 - 23 mg/dL 22   Creatinine 0.96 - 1.24 mg/dL 0.45   Sodium 409 - 811 mmol/L 133   Potassium 3.5 - 5.1 mmol/L 4.7   Chloride 98 - 111 mmol/L 101   CO2 22 - 32 mmol/L 23   Calcium 8.9 - 10.3 mg/dL 9.1   Total Protein 6.5 - 8.1 g/dL 7.6   Total Bilirubin 0.0 - 1.2 mg/dL 0.8   Alkaline Phos 38 - 126 U/L 72   AST 15 - 41  U/L 19   ALT 0 - 44 U/L 12       Latest Ref Rng & Units 05/10/2023   10:52 AM  CBC  WBC 4.0 - 10.5 K/uL 9.4   Hemoglobin 13.0 - 17.0 g/dL 91.4   Hematocrit 78.2 - 52.0 % 38.0   Platelets 150 - 400 K/uL 207     No images are attached to the encounter.  NM Bone Scan Whole Body Result Date: 05/10/2023 CLINICAL DATA:  Metastatic prostate carcinoma. EXAM: NUCLEAR MEDICINE WHOLE BODY BONE SCAN TECHNIQUE: Whole body anterior and posterior images were obtained approximately 3 hours after intravenous injection of radiopharmaceutical. RADIOPHARMACEUTICALS:  21.6 mCi Technetium-82m MDP IV COMPARISON:  CT 04/29/2023 FINDINGS: No foci of radiotracer activity within the axillary or appendicular skeleton to localize metastatic skeletal disease. IMPRESSION: No scintigraphic evidence skeletal metastasis Electronically Signed   By: Genevive Bi M.D.   On: 05/10/2023 16:36   CT CHEST ABDOMEN PELVIS W CONTRAST Result Date: 04/29/2023 CLINICAL DATA:  Metastatic prostate cancer * Tracking Code: BO * EXAM: CT CHEST, ABDOMEN, AND PELVIS WITH CONTRAST TECHNIQUE: Multidetector CT imaging of the chest, abdomen and pelvis was performed following the standard protocol during bolus administration of intravenous contrast. RADIATION DOSE REDUCTION: This exam was performed according to the departmental dose-optimization program which includes automated exposure control, adjustment of the mA and/or kV according to patient size and/or use of iterative reconstruction technique. CONTRAST:  OMNIPAQUE IOHEXOL 300 MG/ML  SOLN COMPARISON:  01/13/2022 FINDINGS: CT CHEST FINDINGS Cardiovascular: Aortic atherosclerosis. Dense aortic valve calcifications. Normal heart size. Left coronary artery calcifications. No pericardial effusion. Mediastinum/Nodes: No enlarged mediastinal, hilar, or axillary lymph nodes. Thyroid gland, trachea, and esophagus demonstrate no significant findings. Lungs/Pleura: Mild bibasilar fibrotic scarring  unchanged. No pleural effusion or pneumothorax. Musculoskeletal: No chest wall abnormality. No acute osseous findings. CT ABDOMEN PELVIS FINDINGS Hepatobiliary: No solid liver abnormality is seen. No gallstones, gallbladder wall thickening, or biliary dilatation. Pancreas: Unremarkable. No pancreatic ductal dilatation or surrounding inflammatory changes. Spleen: Normal in size without significant abnormality. Adrenals/Urinary  Tract: Adrenal glands are unremarkable. Kidneys are normal, without renal calculi, solid lesion, or hydronephrosis. Bladder is unremarkable. Stomach/Bowel: Stomach is within normal limits. Appendix appears normal. No evidence of bowel wall thickening, distention, or inflammatory changes. Vascular/Lymphatic: Aortic atherosclerosis. No enlarged abdominal or pelvic lymph nodes. Reproductive: No mass or other abnormality. Other: Small fat containing inguinal hernias.  No ascites. Musculoskeletal: Unchanged wedge deformity of T3 (series 6, image 66). New although sclerotic and likely nonacute superior endplate Schmorl deformity of L2 (series 6, image 63). Please note that definitively benign incidental findings, functional findings, and anatomic variants may have been intentionally omitted from this report in the interest of brevity and clarity. If not specifically noted and recommended in the impression, no further follow-up or characterization is required for incidental findings. IMPRESSION: 1. No visible prostate mass. No evidence of lymphadenopathy or metastatic disease in the chest, abdomen, or pelvis. 2. Unchanged wedge deformity of T3. New although sclerotic and likely nonacute superior endplate Schmorl deformity of L2. 3. Dense aortic valve calcifications. Correlate for echocardiographic evidence of aortic valve dysfunction. 4. Coronary artery disease. Aortic Atherosclerosis (ICD10-I70.0). Electronically Signed   By: Jearld Lesch M.D.   On: 04/29/2023 21:46     Assessment and plan-  Patient is a 88 y.o. 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    PSA remains undetectable.  He has been getting Lupron every 6 months which she has tolerated well so far and will receive his next dose today.  He will continue Zytiga until progression or toxicity.  He had pathological fracture of the left proximal femur at diagnosis back in August 2021 and areas of uptake in right hemisacrum as well as iliac and diffuse sacral uptake along with uptake in the sternum seventh rib and right third rib as well as multiple areas of spinal involvement noted on PET scan.  All these areas have subsequently responded well to treatment and do not have any uptake on bone scan either.  I have reviewed CT chest abdomen pelvis images and bone scan images and  discussed findings with the patient which does not show any evidence of recurrent or progressive disease. He has responded very well to treatment so far.  CBC with differential CMP and PSA in 3 and 6 months and I will see him back in 6 months.  Will plan to repeat bone density scan in October of this year   Visit Diagnosis 1. Metastatic castration-sensitive adenocarcinoma of prostate (HCC)   2. Adenocarcinoma of prostate (HCC)   3. High risk medication use   4. Encounter for monitoring Lupron therapy      Dr. Owens Shark, MD, MPH [facility] [phone] 05/11/2023 8:35 AM

## 2023-05-19 ENCOUNTER — Other Ambulatory Visit: Payer: Self-pay

## 2023-05-19 ENCOUNTER — Encounter: Payer: Self-pay | Admitting: *Deleted

## 2023-05-19 ENCOUNTER — Emergency Department: Payer: Medicare Other

## 2023-05-19 DIAGNOSIS — I10 Essential (primary) hypertension: Secondary | ICD-10-CM | POA: Diagnosis not present

## 2023-05-19 DIAGNOSIS — Z5111 Encounter for antineoplastic chemotherapy: Secondary | ICD-10-CM | POA: Diagnosis not present

## 2023-05-19 DIAGNOSIS — Z8546 Personal history of malignant neoplasm of prostate: Secondary | ICD-10-CM | POA: Diagnosis not present

## 2023-05-19 DIAGNOSIS — R0789 Other chest pain: Secondary | ICD-10-CM | POA: Diagnosis present

## 2023-05-19 LAB — BASIC METABOLIC PANEL
Anion gap: 11 (ref 5–15)
BUN: 27 mg/dL — ABNORMAL HIGH (ref 8–23)
CO2: 23 mmol/L (ref 22–32)
Calcium: 9 mg/dL (ref 8.9–10.3)
Chloride: 104 mmol/L (ref 98–111)
Creatinine, Ser: 1.24 mg/dL (ref 0.61–1.24)
GFR, Estimated: 57 mL/min — ABNORMAL LOW (ref >60)
Glucose, Bld: 116 mg/dL — ABNORMAL HIGH (ref 70–99)
Potassium: 4.3 mmol/L (ref 3.5–5.1)
Sodium: 138 mmol/L (ref 135–145)

## 2023-05-19 LAB — CBC
HCT: 35.8 % — ABNORMAL LOW (ref 39.0–52.0)
Hemoglobin: 11.7 g/dL — ABNORMAL LOW (ref 13.0–17.0)
MCH: 29.7 pg (ref 26.0–34.0)
MCHC: 32.7 g/dL (ref 30.0–36.0)
MCV: 90.9 fL (ref 80.0–100.0)
Platelets: 178 10*3/uL (ref 150–400)
RBC: 3.94 MIL/uL — ABNORMAL LOW (ref 4.22–5.81)
RDW: 15.4 % (ref 11.5–15.5)
WBC: 6.7 10*3/uL (ref 4.0–10.5)
nRBC: 0 % (ref 0.0–0.2)

## 2023-05-19 LAB — TROPONIN I (HIGH SENSITIVITY): Troponin I (High Sensitivity): 6 ng/L (ref <18)

## 2023-05-19 NOTE — ED Triage Notes (Signed)
 Pt ambulatory to triage.  Pt has left side chest pain  for 2 days.   No n/v  nonradiating pain.   Pt denies sob.  Pt alert.

## 2023-05-20 ENCOUNTER — Emergency Department
Admission: EM | Admit: 2023-05-20 | Discharge: 2023-05-20 | Disposition: A | Discharge status: Home / Self Care | Payer: Medicare Other | Attending: Emergency Medicine | Admitting: Emergency Medicine

## 2023-05-20 DIAGNOSIS — R079 Chest pain, unspecified: Secondary | ICD-10-CM

## 2023-05-20 LAB — TROPONIN I (HIGH SENSITIVITY): Troponin I (High Sensitivity): 7 ng/L (ref <18)

## 2023-05-20 NOTE — ED Provider Notes (Signed)
 [facility] Provider Note    Event Date/Time   First MD Initiated Contact with Patient 05/20/23 0203     (approximate)   History   Chest Pain   HPI  Alexis Cruz is a 88 y.o. male   Past medical history of prostate cancer hypertension, former tobacco user, who presents emerged part for chest pain.  Over the last couple of weeks he had transient sharp left-sided chest pain that is nonradiating and nonexertional in only last for couple seconds before resolving.  No respiratory symptoms like shortness of breath or respiratory infectious symptoms.    He does not have any heart disease nor does have a cardiologist.    Today had several episodes at rest again.  It spontaneously resolved.  He came to the Emergency Department.    Denies GI symptoms, respiratory symptoms as above.  Comfortable now and completely symptomatic.    Independent Historian contributed to assessment above: Wife at bedside corroborates information past medical history above    Physical Exam   Triage Vital Signs: ED Triage Vitals  Encounter Vitals Group     BP 05/19/23 2136 (!) 146/56     Systolic BP Percentile --      Diastolic BP Percentile --      Pulse Rate 05/19/23 2136 82     Resp 05/19/23 2136 18     Temp 05/19/23 2136 98 F (36.7 C)     Temp Source 05/19/23 2136 Oral     SpO2 05/19/23 2136 95 %     Weight 05/19/23 2136 170 lb (77.1 kg)     Height 05/19/23 2136 5\' 5"  (1.651 m)     Head Circumference --      Peak Flow --      Pain Score 05/19/23 2140 5     Pain Loc --      Pain Education --      Exclude from Growth Chart --     Most recent vital signs: Vitals:   05/20/23 0200 05/20/23 0230  BP: (!) 152/59 (!) 157/69  Pulse: 72 78  Resp: 18 19  Temp:    SpO2: 100% 100%    General: Awake, no distress.  CV:  Good peripheral perfusion. Resp:  Normal effort.  Abd:  No distention.  Other:  Pleasant gentleman resting in no acute distress.  Mildly hypertensive  otherwise vital signs normal.  Breathing comfortably, no hypoxemia on room air and clear lungs to auscultation without focality or wheezing.  He appears euvolemic.  He is a soft benign abdominal exam deep palpation all quadrants.  His radial pulses are intact and equal bilaterally.   ED Results / Procedures / Treatments   Labs (all labs ordered are listed, but only abnormal results are displayed) Labs Reviewed  BASIC METABOLIC PANEL - Abnormal; Notable for the following components:      Result Value   Glucose, Bld 116 (*)    BUN 27 (*)    GFR, Estimated 57 (*)    All other components within normal limits  CBC - Abnormal; Notable for the following components:   RBC 3.94 (*)    Hemoglobin 11.7 (*)    HCT 35.8 (*)    All other components within normal limits  TROPONIN I (HIGH SENSITIVITY)  TROPONIN I (HIGH SENSITIVITY)     I ordered and reviewed the above labs they are notable for serial troponins been flat.  Cell counts electrolytes largely unremarkable.  EKG  ED ECG REPORT  Ross Marcus, the attending physician, personally viewed and interpreted this ECG.   Date: 05/20/2023  EKG Time: 2140  Rate: 77  Rhythm: Accelerated junctional rhythm (computer interpretation) versus sinus more favoring sinus rhythm though unclear baseline   Axis: nl  Intervals:none  ST&T Change: no stemi    RADIOLOGY I independently reviewed and interpreted chest x-ray and see no pneumothorax or focality I also reviewed radiologist's formal read.   PROCEDURES:  Critical Care performed: No  Procedures   MEDICATIONS ORDERED IN ED: Medications - No data to display   IMPRESSION / MDM / ASSESSMENT AND PLAN / ED COURSE  I reviewed the triage vital signs and the nursing notes.                                Patient's presentation is most consistent with acute presentation with potential threat to life or bodily function.  Differential diagnosis includes, but is not limited to, ACS,  dissection, PE, pleurisy, musculoskeletal pain, respiratory infection, costochondritis, pneumothorax   The patient is on the cardiac monitor to evaluate for evidence of arrhythmia and/or significant heart rate changes.  MDM:    Elderly gentleman here with very nonspecific chest pain that is fleeting and sharp in nature.  Nonexertional nonradiating, but very very atypical for cardiopulmonary emergencies like ACS dissection or PE.  He is completely asymptomatic now with a nonischemic EKG and serial troponins have been flat.  Not sure what is causing his pain but I doubt it represents life-threatening emergency at this time.  I will have him follow-up with cardiology made a referral for the same.  He will follow-up with his PMD as well.  He will return with any new or worsening symptoms.  Discharge.       FINAL CLINICAL IMPRESSION(S) / ED DIAGNOSES   Final diagnoses:  Nonspecific chest pain     Rx / DC Orders   ED Discharge Orders          Ordered    Ambulatory referral to Cardiology       Comments: If you have not heard from the Cardiology office within the next 72 hours please call [phone].   05/20/23 0304             Note:  This document was prepared using Dragon voice recognition software and may include unintentional dictation errors.    Pilar Jarvis, MD 05/20/23 [phone]

## 2023-05-20 NOTE — Discharge Instructions (Signed)
 Fortunately your evaluation in the Emergency Department did not show any emergency conditions that accounted for your chest pain tonight.  I made a referral to a cardiologist who should call you for a follow-up appointment.  Thank you for choosing [place] for your health care today!  Please see your primary doctor this week for a follow up appointment.   If you have any new, worsening, or unexpected symptoms call your doctor right away or come back to the emergency department for reevaluation.  It was my pleasure to care for you today.   Daneil Dan Modesto Charon, MD

## 2023-05-25 ENCOUNTER — Telehealth: Payer: Self-pay | Admitting: *Deleted

## 2023-05-25 NOTE — Telephone Encounter (Signed)
 I dec per Francine Graven that MD ok for jusust taking 250 mg pil daily rather than 4 tablets a day.[employer] wants to know if he will continue with 250 mg every day and needs to send rx for this patient and if needs to be changed they will need that and rx sent in when we know the dose of abiraterone

## 2023-06-06 ENCOUNTER — Other Ambulatory Visit (HOSPITAL_COMMUNITY): Payer: Self-pay

## 2023-06-06 NOTE — Progress Notes (Signed)
 Specialty Pharmacy Refill Coordination Note  Alexis Cruz is a 88 y.o. male contacted today regarding refills of specialty medication(s) Abiraterone Acetate [name]   Patient requested Delivery   Delivery date: 06/16/23   Verified address: [address] [place] [address]   Medication will be filled on 06/15/23.

## 2023-06-06 NOTE — Progress Notes (Signed)
 Specialty Pharmacy Ongoing Clinical Assessment Note  Alexis Cruz is a 88 y.o. male who is being followed by the specialty pharmacy service for RxSp Oncology   Patient's specialty medication(s) reviewed today: Abiraterone Acetate (ZYTIGA)   Missed doses in the last 4 weeks: 0   Patient/Caregiver did not have any additional questions or concerns.   Therapeutic benefit summary: Patient is achieving benefit   Adverse events/side effects summary: No adverse events/side effects   Patient's therapy is appropriate to: Continue    Goals Addressed             This Visit's Progress    Slow Disease Progression       Patient is on track. Patient will maintain adherence.  PSA remains <0.1 ng/mL as of 05/10/23.          Follow up:  6 months  Servando Snare Specialty Pharmacist

## 2023-06-15 ENCOUNTER — Other Ambulatory Visit: Payer: Self-pay

## 2023-06-16 ENCOUNTER — Other Ambulatory Visit: Payer: Self-pay | Admitting: Oncology

## 2023-06-16 DIAGNOSIS — Z191 Hormone sensitive malignancy status: Secondary | ICD-10-CM

## 2023-06-20 ENCOUNTER — Other Ambulatory Visit: Payer: Self-pay | Admitting: Oncology

## 2023-06-20 DIAGNOSIS — Z191 Hormone sensitive malignancy status: Secondary | ICD-10-CM

## 2023-06-20 MED ORDER — PREDNISONE 5 MG PO TABS: 5.0000 mg | ORAL_TABLET | Freq: Every day | ORAL | 0 refills | Status: DC

## 2023-06-23 ENCOUNTER — Encounter: Payer: Self-pay | Admitting: Oncology

## 2023-07-11 ENCOUNTER — Other Ambulatory Visit: Payer: Self-pay | Admitting: Oncology

## 2023-07-11 ENCOUNTER — Other Ambulatory Visit: Payer: Self-pay

## 2023-07-11 DIAGNOSIS — C61 Malignant neoplasm of prostate: Secondary | ICD-10-CM

## 2023-07-11 MED ORDER — ABIRATERONE ACETATE 250 MG PO TABS
1000.0000 mg | ORAL_TABLET | Freq: Every day | ORAL | 3 refills | Status: DC
  Filled 2023-07-11: qty 120, 30d supply, fill #0
  Filled 2023-08-10: qty 120, 30d supply, fill #1
  Filled 2023-09-13: qty 120, 30d supply, fill #2
  Filled 2023-10-06: qty 120, 30d supply, fill #3

## 2023-07-11 NOTE — Progress Notes (Signed)
 Specialty Pharmacy Refill Coordination Note  Alexis Cruz is a 88 y.o. male contacted today regarding refills of specialty medication(s) Abiraterone  Acetate (ZYTIGA )   Patient requested Delivery   Delivery date: 07/18/23   Verified address: [address] [place] [address]   Medication will be filled on 07/15/23.

## 2023-07-15 ENCOUNTER — Other Ambulatory Visit (HOSPITAL_COMMUNITY): Payer: Self-pay

## 2023-07-15 ENCOUNTER — Other Ambulatory Visit: Payer: Self-pay

## 2023-08-08 ENCOUNTER — Inpatient Hospital Stay: Payer: Medicare Other | Attending: Oncology

## 2023-08-08 DIAGNOSIS — C61 Malignant neoplasm of prostate: Secondary | ICD-10-CM | POA: Insufficient documentation

## 2023-08-08 LAB — CBC WITH DIFFERENTIAL (CANCER CENTER ONLY)
Abs Immature Granulocytes: 0.04 10*3/uL (ref 0.00–0.07)
Basophils Absolute: 0 10*3/uL (ref 0.0–0.1)
Basophils Relative: 0 %
Eosinophils Absolute: 0.1 10*3/uL (ref 0.0–0.5)
Eosinophils Relative: 1 %
HCT: 35.6 % — ABNORMAL LOW (ref 39.0–52.0)
Hemoglobin: 11.4 g/dL — ABNORMAL LOW (ref 13.0–17.0)
Immature Granulocytes: 1 %
Lymphocytes Relative: 9 %
Lymphs Abs: 0.7 10*3/uL (ref 0.7–4.0)
MCH: 28.8 pg (ref 26.0–34.0)
MCHC: 32 g/dL (ref 30.0–36.0)
MCV: 89.9 fL (ref 80.0–100.0)
Monocytes Absolute: 1 10*3/uL (ref 0.1–1.0)
Monocytes Relative: 13 %
Neutro Abs: 5.9 10*3/uL (ref 1.7–7.7)
Neutrophils Relative %: 76 %
Platelet Count: 190 10*3/uL (ref 150–400)
RBC: 3.96 MIL/uL — ABNORMAL LOW (ref 4.22–5.81)
RDW: 15.1 % (ref 11.5–15.5)
Smear Review: NORMAL
WBC Count: 7.7 10*3/uL (ref 4.0–10.5)
nRBC: 0 % (ref 0.0–0.2)

## 2023-08-08 LAB — CMP (CANCER CENTER ONLY)
ALT: 13 U/L (ref 0–44)
AST: 20 U/L (ref 15–41)
Albumin: 4 g/dL (ref 3.5–5.0)
Alkaline Phosphatase: 72 U/L (ref 38–126)
Anion gap: 9 (ref 5–15)
BUN: 20 mg/dL (ref 8–23)
CO2: 22 mmol/L (ref 22–32)
Calcium: 8.6 mg/dL — ABNORMAL LOW (ref 8.9–10.3)
Chloride: 104 mmol/L (ref 98–111)
Creatinine: 1.04 mg/dL (ref 0.61–1.24)
GFR, Estimated: >60 mL/min (ref >60)
Glucose, Bld: 117 mg/dL — ABNORMAL HIGH (ref 70–99)
Potassium: 4.1 mmol/L (ref 3.5–5.1)
Sodium: 135 mmol/L (ref 135–145)
Total Bilirubin: 0.8 mg/dL (ref 0.0–1.2)
Total Protein: 7.1 g/dL (ref 6.5–8.1)

## 2023-08-08 LAB — PSA: Prostatic Specific Antigen: 0.02 ng/mL (ref 0.00–4.00)

## 2023-08-10 ENCOUNTER — Other Ambulatory Visit: Payer: Self-pay

## 2023-08-10 NOTE — Progress Notes (Signed)
 Specialty Pharmacy Refill Coordination Note  Alexis Cruz is a 88 y.o. male contacted today regarding refills of specialty medication(s) Abiraterone  Acetate (ZYTIGA )   Patient requested (Patient-Rptd) Delivery   Delivery date: (Patient-Rptd) 08/22/23   Verified address: (Patient-Rptd) [address] [place] [address]   Medication will be filled on 08/19/23.

## 2023-08-19 ENCOUNTER — Encounter: Payer: Self-pay | Admitting: Oncology

## 2023-08-19 ENCOUNTER — Telehealth: Payer: Self-pay | Admitting: Pharmacy Technician

## 2023-08-19 ENCOUNTER — Other Ambulatory Visit (HOSPITAL_COMMUNITY): Payer: Self-pay

## 2023-08-19 ENCOUNTER — Other Ambulatory Visit: Payer: Self-pay

## 2023-08-19 NOTE — Telephone Encounter (Signed)
 Oral Oncology Patient Advocate Encounter  Was successful in securing patient a $6000 grant from [facility] to provide copayment coverage for Abiraterone .  This will keep the out of pocket expense at $0.     [employer] ID: [id number]   The billing information is as follows and has been shared with [facility].    RxBin: [id number] PCN: [id number] Member ID: [id number] Group ID: [id number] Dates of Eligibility: 07/20/2023 through 07/15/2024  Fund:  Prostate Cancer - Medicare Access  Edgard (Patty) Benjaman Branch, CPhT  [facility], [place], Cristine Done, [place] Oral Chemotherapy Patient Advocate Phone: [phone]  Fax: [phone]

## 2023-09-13 ENCOUNTER — Other Ambulatory Visit (HOSPITAL_COMMUNITY): Payer: Self-pay

## 2023-09-13 ENCOUNTER — Other Ambulatory Visit: Payer: Self-pay

## 2023-09-13 NOTE — Progress Notes (Signed)
 Specialty Pharmacy Refill Coordination Note  Alexis Cruz is a 88 y.o. male contacted today regarding refills of specialty medication(s) Abiraterone  Acetate (ZYTIGA )   Patient requested Delivery   Delivery date: 09/15/23   Verified address: [address]   [facility] [place] [address]   Medication will be filled on 09/14/23.

## 2023-09-15 ENCOUNTER — Other Ambulatory Visit: Payer: Self-pay | Admitting: Oncology

## 2023-09-15 DIAGNOSIS — C61 Malignant neoplasm of prostate: Secondary | ICD-10-CM

## 2023-10-04 ENCOUNTER — Other Ambulatory Visit (HOSPITAL_COMMUNITY): Payer: Self-pay

## 2023-10-06 ENCOUNTER — Other Ambulatory Visit: Payer: Self-pay | Admitting: Pharmacy Technician

## 2023-10-06 ENCOUNTER — Other Ambulatory Visit: Payer: Self-pay

## 2023-10-06 NOTE — Progress Notes (Signed)
 Specialty Pharmacy Refill Coordination Note  Alexis Cruz is a 88 y.o. male contacted today regarding refills of specialty medication(s) Abiraterone  Acetate (ZYTIGA )   Patient requested Delivery   Delivery date: 10/13/23   Verified address: [address]  [facility] [place]  [id number]   Medication will be filled on 10/12/23.   Spoke to patient's spouse.

## 2023-10-12 ENCOUNTER — Other Ambulatory Visit: Payer: Self-pay

## 2023-11-01 ENCOUNTER — Other Ambulatory Visit: Payer: Self-pay

## 2023-11-04 ENCOUNTER — Other Ambulatory Visit: Payer: Self-pay | Admitting: Oncology

## 2023-11-04 ENCOUNTER — Other Ambulatory Visit: Payer: Self-pay

## 2023-11-04 DIAGNOSIS — C61 Malignant neoplasm of prostate: Secondary | ICD-10-CM

## 2023-11-04 NOTE — Progress Notes (Signed)
 Specialty Pharmacy Refill Coordination Note  Alexis Cruz is a 88 y.o. male, patients wife was contacted today regarding refills of specialty medication(s) Abiraterone  Acetate (ZYTIGA )   Patient requested Delivery   Delivery date: 11/15/23   Verified address: [address]  [facility] [place] [address]   Medication will be filled on 11/14/23.   This fill date is pending response to refill request from provider. Patient is aware and if they have not received fill by intended date they must follow up with pharmacy.

## 2023-11-07 ENCOUNTER — Encounter: Payer: Self-pay | Admitting: Oncology

## 2023-11-07 ENCOUNTER — Other Ambulatory Visit: Payer: Self-pay

## 2023-11-07 MED ORDER — ABIRATERONE ACETATE 250 MG PO TABS
1000.0000 mg | ORAL_TABLET | Freq: Every day | ORAL | 3 refills | Status: DC
  Filled 2023-11-07: qty 120, 30d supply, fill #0
  Filled 2023-11-30: qty 120, 30d supply, fill #1
  Filled 2024-01-09: qty 120, 30d supply, fill #2
  Filled 2024-02-09: qty 120, 30d supply, fill #3

## 2023-11-08 ENCOUNTER — Inpatient Hospital Stay: Payer: Medicare Other

## 2023-11-08 ENCOUNTER — Encounter: Payer: Self-pay | Admitting: Oncology

## 2023-11-08 ENCOUNTER — Inpatient Hospital Stay (HOSPITAL_BASED_OUTPATIENT_CLINIC_OR_DEPARTMENT_OTHER): Payer: Medicare Other | Admitting: Oncology

## 2023-11-08 ENCOUNTER — Inpatient Hospital Stay: Payer: Medicare Other | Attending: Oncology

## 2023-11-08 VITALS — BP 146/62 | HR 73 | Temp 97.6°F | Resp 20 | Wt 171.6 lb

## 2023-11-08 DIAGNOSIS — Z9189 Other specified personal risk factors, not elsewhere classified: Secondary | ICD-10-CM

## 2023-11-08 DIAGNOSIS — C61 Malignant neoplasm of prostate: Secondary | ICD-10-CM | POA: Diagnosis not present

## 2023-11-08 DIAGNOSIS — M810 Age-related osteoporosis without current pathological fracture: Secondary | ICD-10-CM

## 2023-11-08 DIAGNOSIS — C7951 Secondary malignant neoplasm of bone: Secondary | ICD-10-CM | POA: Diagnosis not present

## 2023-11-08 DIAGNOSIS — Z5181 Encounter for therapeutic drug level monitoring: Secondary | ICD-10-CM

## 2023-11-08 DIAGNOSIS — Z191 Hormone sensitive malignancy status: Secondary | ICD-10-CM

## 2023-11-08 DIAGNOSIS — Z79818 Long term (current) use of other agents affecting estrogen receptors and estrogen levels: Secondary | ICD-10-CM

## 2023-11-08 DIAGNOSIS — Z79899 Other long term (current) drug therapy: Secondary | ICD-10-CM

## 2023-11-08 DIAGNOSIS — Z5111 Encounter for antineoplastic chemotherapy: Secondary | ICD-10-CM | POA: Diagnosis present

## 2023-11-08 LAB — CMP (CANCER CENTER ONLY)
ALT: 12 U/L (ref 0–44)
AST: 18 U/L (ref 15–41)
Albumin: 4 g/dL (ref 3.5–5.0)
Alkaline Phosphatase: 74 U/L (ref 38–126)
Anion gap: 10 (ref 5–15)
BUN: 22 mg/dL (ref 8–23)
CO2: 21 mmol/L — ABNORMAL LOW (ref 22–32)
Calcium: 8.8 mg/dL — ABNORMAL LOW (ref 8.9–10.3)
Chloride: 103 mmol/L (ref 98–111)
Creatinine: 1.17 mg/dL (ref 0.61–1.24)
GFR, Estimated: >60 mL/min (ref >60)
Glucose, Bld: 106 mg/dL — ABNORMAL HIGH (ref 70–99)
Potassium: 4 mmol/L (ref 3.5–5.1)
Sodium: 134 mmol/L — ABNORMAL LOW (ref 135–145)
Total Bilirubin: 0.6 mg/dL (ref 0.0–1.2)
Total Protein: 6.9 g/dL (ref 6.5–8.1)

## 2023-11-08 LAB — CBC WITH DIFFERENTIAL (CANCER CENTER ONLY)
Abs Immature Granulocytes: 0.07 K/uL (ref 0.00–0.07)
Basophils Absolute: 0 K/uL (ref 0.0–0.1)
Basophils Relative: 0 %
Eosinophils Absolute: 0 K/uL (ref 0.0–0.5)
Eosinophils Relative: 0 %
HCT: 37 % — ABNORMAL LOW (ref 39.0–52.0)
Hemoglobin: 11.7 g/dL — ABNORMAL LOW (ref 13.0–17.0)
Immature Granulocytes: 1 %
Lymphocytes Relative: 11 %
Lymphs Abs: 1.1 K/uL (ref 0.7–4.0)
MCH: 29.3 pg (ref 26.0–34.0)
MCHC: 31.6 g/dL (ref 30.0–36.0)
MCV: 92.5 fL (ref 80.0–100.0)
Monocytes Absolute: 1.4 K/uL — ABNORMAL HIGH (ref 0.1–1.0)
Monocytes Relative: 14 %
Neutro Abs: 7.5 K/uL (ref 1.7–7.7)
Neutrophils Relative %: 74 %
Platelet Count: 173 K/uL (ref 150–400)
RBC: 4 MIL/uL — ABNORMAL LOW (ref 4.22–5.81)
RDW: 15.4 % (ref 11.5–15.5)
Smear Review: NORMAL
WBC Count: 10.1 K/uL (ref 4.0–10.5)
nRBC: 0 % (ref 0.0–0.2)

## 2023-11-08 LAB — PSA: Prostatic Specific Antigen: <0.01 ng/mL (ref 0.00–4.00)

## 2023-11-08 MED ORDER — LEUPROLIDE ACETATE (6 MONTH) 45 MG ~~LOC~~ KIT
45.0000 mg | PACK | Freq: Once | SUBCUTANEOUS | Status: AC
  Administered 2023-11-08: 45 mg via SUBCUTANEOUS
  Filled 2023-11-08: qty 45

## 2023-11-08 NOTE — Progress Notes (Signed)
 Checking status of Zytiga  submitted for fill yesterday 11/07/23.  Per Patty via secure chat Looks like the pharmacy contacted him and he has med scheduled to be delivered on 08/26.

## 2023-11-08 NOTE — Progress Notes (Signed)
 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 Tobie for symptoms of left hip pain which prompted x-rays followed by MRI of the left hip without contrast.  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  Patient was subsequently seen by orthopedics Dr. Pathology who did not recommend any orthopedic intervention for the left hip fracture he has been referred to oncology for further management patient's last PSA was checked in 2014 which was elevated at 7.4.  Patient is also a chronic smoker and smokes about 1 pack of cigarettes per day since 1968.   Head CT scan showed widespread bony metastatic disease with pathologic fracture of the left acetabulum.  Pathologic fracture of the T3 vertebral body with 50% loss of height possible fracture of the left seventh rib.  No evidence of visceral metastases.  PSA elevated at 35.  Bone biopsy consistent with prostate adenocarcinoma.  Unable to obtain NGS testing on bone sample   Patient received Firmagon  and is currently on Zytiga .  Zytiga  started in November 2021   Patient also seen at [facility] for second opinion related with Zytiga  and ADT.  They recommended bisphosphonates given pathologic fracture of the acetabulum.  However patient did not wish to  take it given the risk of osteonecrosis of the jaw      Interval history-patient is doing well overall.  He is tolerating Zytiga  without any significant side effects.  No recent falls.  He has not started taking his Fosamax  yet  ECOG PS- 2 Pain scale- 0   Review of systems- Review of Systems  Constitutional:  Positive for malaise/fatigue. Negative for chills, fever and weight loss.  HENT:  Negative for congestion, ear discharge and nosebleeds.   Eyes:  Negative for blurred vision.  Respiratory:  Negative for cough, hemoptysis, sputum production, shortness of breath and wheezing.   Cardiovascular:  Negative for chest pain, palpitations, orthopnea and claudication.  Gastrointestinal:  Negative for abdominal pain, blood in stool, constipation, diarrhea, heartburn, melena, nausea and vomiting.  Genitourinary:  Negative for dysuria, flank pain, frequency, hematuria and urgency.  Musculoskeletal:  Negative for back pain, joint pain and myalgias.  Skin:  Negative for rash.  Neurological:  Negative for dizziness, tingling, focal weakness, seizures, weakness and headaches.  Endo/Heme/Allergies:  Does not bruise/bleed easily.  Psychiatric/Behavioral:  Negative for depression and suicidal ideas. The patient does not have insomnia.       Allergies  Allergen Reactions   Citalopram Other (See Comments)    Unsure but family remembers that he could not take the dru-not sure what happened     Past Medical History:  Diagnosis Date   Hypertension    Prostate cancer (HCC)      History reviewed. No pertinent surgical history.  Social History   Socioeconomic History   Marital status: Married    Spouse name: Not on file   Number  of children: Not on file   Years of education: Not on file   Highest education level: Not on file  Occupational History   Not on file  Tobacco Use   Smoking status: Former    Types: Pipe   Smokeless tobacco: Former   Tobacco comments:    stopped pipes 3  month ago  Vaping Use   Vaping status: Never Used  Substance and Sexual Activity   Alcohol use: Not Currently   Drug use: Never   Sexual activity: Not on file  Other Topics Concern   Not on file  Social History Narrative   Not on file   Social Drivers of Health   Financial Resource Strain: Low Risk  (09/13/2023)   Received from [facility]   Overall Financial Resource Strain (CARDIA)    Difficulty of Paying Living Expenses: Not hard at all  Food Insecurity: No Food Insecurity (09/13/2023)   Received from [facility]   Hunger Vital Sign    Within the past 12 months, you worried that your food would run out before you got the money to buy more.: Never true    Within the past 12 months, the food you bought just didn't last and you didn't have money to get more.: Never true  Transportation Needs: No Transportation Needs (09/13/2023)   Received from [facility] - Transportation    In the past 12 months, has lack of transportation kept you from medical appointments or from getting medications?: No    Lack of Transportation (Non-Medical): No  Physical Activity: Not on file  Stress: Not on file  Social Connections: Unknown (07/31/2021)   Received from [facility]   Social Network    Social Network: Not on file  Intimate Partner Violence: Unknown (06/22/2021)   Received from [facility]   HITS    Physically Hurt: Not on file    Insult or Talk Down To: Not on file    Threaten Physical Harm: Not on file    Scream or Curse: Not on file    History reviewed. No pertinent family history.   Current Outpatient Medications:    abiraterone  acetate (ZYTIGA ) 250 MG tablet, Take 4 tablets (1,000 mg total) by mouth daily., Disp: 120 tablet, Rfl: 3   amLODipine (NORVASC) 5 MG tablet, Take 5 mg by mouth daily., Disp: , Rfl:    predniSONE  (DELTASONE ) 5 MG tablet, Take 1 tablet by mouth once daily with breakfast, Disp: 90 tablet,  Rfl: 0   senna (SENOKOT) 8.6 MG tablet, Take by mouth., Disp: , Rfl:    alendronate  (FOSAMAX ) 70 MG tablet, Take 1 tablet (70 mg total) by mouth once a week. Take with a full glass of water on an empty stomach. (Patient not taking: Reported on 11/08/2023), Disp: 12 tablet, Rfl: 0   cyanocobalamin 1000 MCG tablet, Take by mouth. (Patient not taking: Reported on 11/08/2023), Disp: , Rfl:    Docusate Sodium (DSS) 100 MG CAPS, Take by mouth. (Patient not taking: Reported on 11/08/2023), Disp: , Rfl:    donepezil (ARICEPT) 5 MG tablet, Take by mouth. (Patient not taking: Reported on 11/08/2023), Disp: , Rfl:    hydrALAZINE (APRESOLINE) 25 MG tablet, Take by mouth. (Patient not taking: Reported on 11/08/2023), Disp: , Rfl:    Omega-3 Fatty Acids (FISH OIL) 1200 MG CAPS, Take 1,200 mg by mouth daily. (Patient not taking: Reported on 11/08/2023), Disp: , Rfl:    traMADol  (ULTRAM ) 50  MG tablet, Take 50 mg by mouth every 6 (six) hours as needed. (Patient not taking: Reported on 11/08/2023), Disp: , Rfl:    zinc gluconate 50 MG tablet, Take 50 mg by mouth daily. (Patient not taking: Reported on 11/08/2023), Disp: , Rfl:  No current facility-administered medications for this visit.  Facility-Administered Medications Ordered in Other Visits:    Leuprolide  Acetate (3 Month) (ELIGARD ) 22.5 MG injection 22.5 mg, 22.5 mg, Subcutaneous, Q90 days, Melanee Annah BROCKS, MD, 22.5 mg at 05/06/20 1116   Leuprolide  Acetate (3 Month) (ELIGARD ) 22.5 MG injection 22.5 mg, 22.5 mg, Subcutaneous, Q90 days, Melanee Annah BROCKS, MD, 22.5 mg at 08/04/20 1044  Physical exam:  Vitals:   11/08/23 1103  BP: (!) 146/62  Pulse: 73  Resp: 20  Temp: 97.6 F (36.4 C)  SpO2: 100%  Weight: 171 lb 9.6 oz (77.8 kg)   Physical Exam HENT:     Head: Atraumatic.  Cardiovascular:     Rate and Rhythm: Normal rate and regular rhythm.     Heart sounds: Normal heart sounds.  Pulmonary:     Effort: Pulmonary effort is normal.     Breath sounds: Normal  breath sounds.  Skin:    General: Skin is warm and dry.  Neurological:     Mental Status: He is alert and oriented to person, place, and time.      I have personally reviewed labs listed below:    Latest Ref Rng & Units 11/08/2023   10:51 AM  CMP  Glucose 70 - 99 mg/dL 893   BUN 8 - 23 mg/dL 22   Creatinine 9.38 - 1.24 mg/dL 8.82   Sodium 864 - 854 mmol/L 134   Potassium 3.5 - 5.1 mmol/L 4.0   Chloride 98 - 111 mmol/L 103   CO2 22 - 32 mmol/L 21   Calcium 8.9 - 10.3 mg/dL 8.8   Total Protein 6.5 - 8.1 g/dL 6.9   Total Bilirubin 0.0 - 1.2 mg/dL 0.6   Alkaline Phos 38 - 126 U/L 74   AST 15 - 41 U/L 18   ALT 0 - 44 U/L 12       Latest Ref Rng & Units 11/08/2023   10:51 AM  CBC  WBC 4.0 - 10.5 K/uL 10.1   Hemoglobin 13.0 - 17.0 g/dL 88.2   Hematocrit 60.9 - 52.0 % 37.0   Platelets 150 - 400 K/uL 173      Assessment and plan- Patient is a 88 y.o. 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  His last PSA was 0.02 mildly elevated as compared to his prior values when it was undetectable.  He will continue with Eligard  plus Zytiga  at this time.  Eligard  today.  CBC with differential CMP PSA and see me in 6 months and he receives Eligard .  CT chest abdomen pelvis with contrast prior.  He is also due for repeat bone density scan which we will schedule for October 2025.  He is willing to start taking his weekly Fosamax  at this time   Visit Diagnosis 1. Osteoporosis, unspecified osteoporosis type, unspecified pathological fracture presence   2. Metastatic castration-sensitive adenocarcinoma of prostate (HCC)   3. High risk medication use   4. At risk for bone density loss   5. Encounter for monitoring Lupron  therapy      Dr. Annah Melanee, MD, MPH [facility] at [facility]  Center [phone] 11/08/2023 1:10 PM

## 2023-11-14 ENCOUNTER — Other Ambulatory Visit: Payer: Self-pay

## 2023-11-30 ENCOUNTER — Other Ambulatory Visit: Payer: Self-pay

## 2023-11-30 NOTE — Progress Notes (Signed)
 Specialty Pharmacy Ongoing Clinical Assessment Note  Alexis Cruz is a 88 y.o. male who is being followed by the specialty pharmacy service for RxSp Oncology   Patient's specialty medication(s) reviewed today: Abiraterone  Acetate (ZYTIGA )   Missed doses in the last 4 weeks: 1   Patient/Caregiver did not have any additional questions or concerns.   Therapeutic benefit summary: Patient is achieving benefit   Adverse events/side effects summary: No adverse events/side effects   Patient's therapy is appropriate to: Continue    Goals Addressed             This Visit's Progress    Slow Disease Progression   On track    Patient is on track. Patient will maintain adherence.  PSA < 0.01 11/08/23, consistent with previous levels. Was 0.02 on 08/08/23 but otherwise < 0.01 since 2023.        Follow up: 6 months  Powell CHRISTELLA Gallus Specialty Pharmacist

## 2023-11-30 NOTE — Progress Notes (Signed)
 Specialty Pharmacy Refill Coordination Note  Alexis Cruz is a 88 y.o. male contacted today regarding refills of specialty medication(s) Abiraterone  Acetate (ZYTIGA )   Patient requested Delivery   Delivery date: 12/14/23   Verified address: [address]  [facility] [place] [address]   Medication will be filled on 12/13/23.

## 2023-12-13 ENCOUNTER — Other Ambulatory Visit: Payer: Self-pay | Admitting: Oncology

## 2023-12-13 DIAGNOSIS — C61 Malignant neoplasm of prostate: Secondary | ICD-10-CM

## 2023-12-14 ENCOUNTER — Encounter: Payer: Self-pay | Admitting: Oncology

## 2024-01-05 ENCOUNTER — Other Ambulatory Visit (HOSPITAL_COMMUNITY): Payer: Self-pay

## 2024-01-09 ENCOUNTER — Other Ambulatory Visit: Payer: Self-pay

## 2024-01-09 ENCOUNTER — Other Ambulatory Visit (HOSPITAL_COMMUNITY): Payer: Self-pay

## 2024-01-09 NOTE — Progress Notes (Signed)
 Specialty Pharmacy Refill Coordination Note  Alexis Cruz is a 88 y.o. male contacted today regarding refills of specialty medication(s) Abiraterone  Acetate (ZYTIGA )   Patient requested Delivery   Delivery date: 01/17/24   Verified address: [address]  [facility] [place] [address]   Medication will be filled on 01/16/24.

## 2024-01-16 ENCOUNTER — Other Ambulatory Visit: Payer: Self-pay

## 2024-01-20 ENCOUNTER — Encounter: Payer: Self-pay | Admitting: Oncology

## 2024-02-08 ENCOUNTER — Inpatient Hospital Stay: Attending: Oncology

## 2024-02-08 DIAGNOSIS — C61 Malignant neoplasm of prostate: Secondary | ICD-10-CM | POA: Diagnosis present

## 2024-02-08 DIAGNOSIS — C7951 Secondary malignant neoplasm of bone: Secondary | ICD-10-CM | POA: Insufficient documentation

## 2024-02-08 DIAGNOSIS — M810 Age-related osteoporosis without current pathological fracture: Secondary | ICD-10-CM | POA: Diagnosis not present

## 2024-02-08 LAB — CBC WITH DIFFERENTIAL (CANCER CENTER ONLY)
Abs Immature Granulocytes: 0.07 K/uL (ref 0.00–0.07)
Basophils Absolute: 0 K/uL (ref 0.0–0.1)
Basophils Relative: 0 %
Eosinophils Absolute: 0.1 K/uL (ref 0.0–0.5)
Eosinophils Relative: 1 %
HCT: 36 % — ABNORMAL LOW (ref 39.0–52.0)
Hemoglobin: 11.6 g/dL — ABNORMAL LOW (ref 13.0–17.0)
Immature Granulocytes: 1 %
Lymphocytes Relative: 12 %
Lymphs Abs: 1.3 K/uL (ref 0.7–4.0)
MCH: 28.9 pg (ref 26.0–34.0)
MCHC: 32.2 g/dL (ref 30.0–36.0)
MCV: 89.6 fL (ref 80.0–100.0)
Monocytes Absolute: 2 K/uL — ABNORMAL HIGH (ref 0.1–1.0)
Monocytes Relative: 18 %
Neutro Abs: 7.6 K/uL (ref 1.7–7.7)
Neutrophils Relative %: 68 %
Platelet Count: 195 K/uL (ref 150–400)
RBC: 4.02 MIL/uL — ABNORMAL LOW (ref 4.22–5.81)
RDW: 15.7 % — ABNORMAL HIGH (ref 11.5–15.5)
WBC Count: 11 K/uL — ABNORMAL HIGH (ref 4.0–10.5)
nRBC: 0 % (ref 0.0–0.2)

## 2024-02-08 LAB — CMP (CANCER CENTER ONLY)
ALT: 10 U/L (ref 0–44)
AST: 19 U/L (ref 15–41)
Albumin: 4.2 g/dL (ref 3.5–5.0)
Alkaline Phosphatase: 78 U/L (ref 38–126)
Anion gap: 11 (ref 5–15)
BUN: 22 mg/dL (ref 8–23)
CO2: 21 mmol/L — ABNORMAL LOW (ref 22–32)
Calcium: 9.1 mg/dL (ref 8.9–10.3)
Chloride: 104 mmol/L (ref 98–111)
Creatinine: 1.36 mg/dL — ABNORMAL HIGH (ref 0.61–1.24)
GFR, Estimated: 51 mL/min — ABNORMAL LOW (ref >60)
Glucose, Bld: 92 mg/dL (ref 70–99)
Potassium: 4.6 mmol/L (ref 3.5–5.1)
Sodium: 136 mmol/L (ref 135–145)
Total Bilirubin: 0.6 mg/dL (ref 0.0–1.2)
Total Protein: 7.3 g/dL (ref 6.5–8.1)

## 2024-02-08 LAB — PSA: Prostatic Specific Antigen: <0.02 ng/mL (ref 0.00–4.00)

## 2024-02-09 ENCOUNTER — Other Ambulatory Visit: Payer: Self-pay

## 2024-02-13 ENCOUNTER — Other Ambulatory Visit: Payer: Self-pay

## 2024-02-13 NOTE — Progress Notes (Signed)
 Specialty Pharmacy Refill Coordination Note  Alexis Cruz is a 88 y.o. male contacted today regarding refills of specialty medication(s) Abiraterone  Acetate (ZYTIGA )   Patient requested Delivery   Delivery date: 02/14/24   Verified address: [address]  [facility] [place] [address]   Medication will be filled on: 02/13/24

## 2024-02-13 NOTE — Progress Notes (Signed)
 Clinical Intervention Note  Clinical Intervention Notes: Patient reported starting a zinc supplement. No DDIs identified with Zytiga .   Clinical Intervention Outcomes: Prevention of an adverse drug event   Advertising Account Planner

## 2024-03-05 ENCOUNTER — Other Ambulatory Visit: Payer: Self-pay

## 2024-03-05 ENCOUNTER — Other Ambulatory Visit: Payer: Self-pay | Admitting: Oncology

## 2024-03-05 DIAGNOSIS — C61 Malignant neoplasm of prostate: Secondary | ICD-10-CM

## 2024-03-06 ENCOUNTER — Other Ambulatory Visit: Payer: Self-pay

## 2024-03-06 ENCOUNTER — Encounter: Payer: Self-pay | Admitting: Oncology

## 2024-03-06 MED ORDER — ABIRATERONE ACETATE 250 MG PO TABS
1000.0000 mg | ORAL_TABLET | Freq: Every day | ORAL | 3 refills | Status: AC
  Filled 2024-03-06: qty 120, 30d supply, fill #0
  Filled 2024-04-11: qty 120, 30d supply, fill #1

## 2024-03-08 ENCOUNTER — Other Ambulatory Visit: Payer: Self-pay

## 2024-03-08 NOTE — Progress Notes (Signed)
 Specialty Pharmacy Refill Coordination Note  I spoke to the patient's wife. Alexis Cruz is a 88 y.o. male contacted today regarding refills of specialty medication(s) Abiraterone  Acetate (ZYTIGA )   Patient requested Delivery   Delivery date: 03/20/24   Verified address: [address]  [facility] [place] [address]   Medication will be filled on: 03/19/24

## 2024-03-13 DIAGNOSIS — C61 Malignant neoplasm of prostate: Secondary | ICD-10-CM

## 2024-03-19 ENCOUNTER — Other Ambulatory Visit: Payer: Self-pay

## 2024-03-23 ENCOUNTER — Encounter
Admission: RE | Admit: 2024-03-23 | Discharge: 2024-03-23 | Disposition: A | Discharge status: Home / Self Care | Source: Ambulatory Visit | Attending: Oncology | Admitting: Oncology

## 2024-03-23 DIAGNOSIS — Z191 Hormone sensitive malignancy status: Secondary | ICD-10-CM | POA: Diagnosis present

## 2024-03-23 DIAGNOSIS — C61 Malignant neoplasm of prostate: Secondary | ICD-10-CM | POA: Insufficient documentation

## 2024-03-23 MED ORDER — TECHNETIUM TC 99M MEDRONATE IV KIT
20.0000 | PACK | Freq: Once | INTRAVENOUS | Status: AC | PRN
  Administered 2024-03-23: 21.16 via INTRAVENOUS

## 2024-04-11 ENCOUNTER — Other Ambulatory Visit: Payer: Self-pay

## 2024-04-13 ENCOUNTER — Other Ambulatory Visit: Payer: Self-pay

## 2024-04-13 NOTE — Progress Notes (Signed)
 Specialty Pharmacy Refill Coordination Note  Alexis Cruz is a 89 y.o. male contacted today regarding refills of specialty medication(s) Abiraterone  Acetate (ZYTIGA )   Patient requested Delivery   Delivery date: 04/20/24   Verified address: [address]  [facility] [place] [address]   Medication will be filled on: 04/19/24

## 2024-04-19 ENCOUNTER — Other Ambulatory Visit: Payer: Self-pay

## 2024-05-01 ENCOUNTER — Ambulatory Visit

## 2024-05-15 ENCOUNTER — Ambulatory Visit

## 2024-05-15 ENCOUNTER — Other Ambulatory Visit

## 2024-05-15 ENCOUNTER — Ambulatory Visit: Admitting: Oncology
